# Patient Record
Sex: Female | Born: 1985 | Race: White | Hispanic: No | Marital: Married | Smoking: Never smoker
Health system: Southern US, Community
[De-identification: ages and names within clinical notes are randomized; demographics above are authoritative.]

## PROBLEM LIST (undated history)

## (undated) ENCOUNTER — Inpatient Hospital Stay (HOSPITAL_COMMUNITY): Payer: Self-pay

## (undated) DIAGNOSIS — F419 Anxiety disorder, unspecified: Secondary | ICD-10-CM

## (undated) DIAGNOSIS — G43909 Migraine, unspecified, not intractable, without status migrainosus: Secondary | ICD-10-CM

## (undated) DIAGNOSIS — F32A Depression, unspecified: Secondary | ICD-10-CM

## (undated) DIAGNOSIS — N6019 Diffuse cystic mastopathy of unspecified breast: Secondary | ICD-10-CM

## (undated) DIAGNOSIS — D649 Anemia, unspecified: Secondary | ICD-10-CM

## (undated) DIAGNOSIS — K219 Gastro-esophageal reflux disease without esophagitis: Secondary | ICD-10-CM

## (undated) HISTORY — DX: Depression, unspecified: F32.A

## (undated) HISTORY — PX: BREAST BIOPSY: SHX20

## (undated) HISTORY — DX: Gastro-esophageal reflux disease without esophagitis: K21.9

## (undated) HISTORY — DX: Migraine, unspecified, not intractable, without status migrainosus: G43.909

## (undated) HISTORY — DX: Anemia, unspecified: D64.9

## (undated) HISTORY — DX: Anxiety disorder, unspecified: F41.9

## (undated) HISTORY — PX: TONSILLECTOMY: SUR1361

## (undated) HISTORY — DX: Diffuse cystic mastopathy of unspecified breast: N60.19

## (undated) HISTORY — PX: TYMPANOSTOMY TUBE PLACEMENT: SHX32

---
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
## 2007-12-21 DIAGNOSIS — G43909 Migraine, unspecified, not intractable, without status migrainosus: Secondary | ICD-10-CM

## 2007-12-21 HISTORY — DX: Migraine, unspecified, not intractable, without status migrainosus: G43.909

## 2007-12-30 ENCOUNTER — Other Ambulatory Visit: Admission: RE | Admit: 2007-12-30 | Discharge: 2007-12-30 | Payer: Self-pay | Admitting: Obstetrics & Gynecology

## 2009-10-12 ENCOUNTER — Encounter: Admission: RE | Admit: 2009-10-12 | Discharge: 2009-10-12 | Payer: Self-pay | Admitting: Obstetrics & Gynecology

## 2011-04-08 LAB — HM PAP SMEAR: HM Pap smear: NEGATIVE

## 2011-04-13 ENCOUNTER — Ambulatory Visit (INDEPENDENT_AMBULATORY_CARE_PROVIDER_SITE_OTHER): Payer: BC Managed Care – PPO | Admitting: Family Medicine

## 2011-04-13 VITALS — BP 118/75 | HR 72 | Temp 98.5°F | Resp 16 | Ht 68.0 in | Wt 155.0 lb

## 2011-04-13 DIAGNOSIS — J029 Acute pharyngitis, unspecified: Secondary | ICD-10-CM

## 2011-04-13 DIAGNOSIS — J329 Chronic sinusitis, unspecified: Secondary | ICD-10-CM

## 2011-04-13 LAB — POCT RAPID STREP A (OFFICE): Rapid Strep A Screen: NEGATIVE

## 2011-04-13 MED ORDER — AMOXICILLIN 875 MG PO TABS: 875.0000 mg | ORAL_TABLET | Freq: Two times a day (BID) | ORAL | Status: AC

## 2011-04-13 NOTE — Patient Instructions (Signed)
Migraine Headache A migraine headache is an intense, throbbing pain on one or both sides of your head. The exact cause of a migraine headache is not always known. A migraine may be caused when nerves in the brain become irritated and release chemicals that cause swelling within blood vessels, causing pain. Many migraine sufferers have a family history of migraines. Before you get a migraine you may or may not get an aura. An aura is a group of symptoms that can predict the beginning of a migraine. An aura may include:  Visual changes such as:   Flashing lights.   Bright spots or zig-zag lines.   Tunnel vision.   Feelings of numbness.   Trouble talking.   Muscle weakness.  SYMPTOMS  Pain on one or both sides of your head.   Pain that is pulsating or throbbing in nature.   Pain that is severe enough to prevent daily activities.   Pain that is aggravated by any daily physical activity.   Nausea (feeling sick to your stomach), vomiting, or both.   Pain with exposure to bright lights, loud noises, or activity.   General sensitivity to bright lights or loud noises.  MIGRAINE TRIGGERS Examples of triggers of migraine headaches include:   Alcohol.   Smoking.   Stress.   It may be related to menses (female menstruation).   Aged cheeses.   Foods or drinks that contain nitrates, glutamate, aspartame, or tyramine.   Lack of sleep.   Chocolate.   Caffeine.   Hunger.   Medications such as nitroglycerine (used to treat chest pain), birth control pills, estrogen, and some blood pressure medications.  DIAGNOSIS  A migraine headache is often diagnosed based on:  Symptoms.   Physical examination.   A computerized X-ray scan (computed tomography, CT) of your head.  TREATMENT  Medications can help prevent migraines if they are recurrent or should they become recurrent. Your caregiver can help you with a medication or treatment program that will be helpful to you.   Lying  down in a dark, quiet room may be helpful.   Keeping a headache diary may help you find a trend as to what may be triggering your headaches.  SEEK IMMEDIATE MEDICAL CARE IF:   You have confusion, personality changes or seizures.   You have headaches that wake you from sleep.   You have an increased frequency in your headaches.   You have a stiff neck.   You have a loss of vision.   You have muscle weakness.   You start losing your balance or have trouble walking.   You feel faint or pass out.  MAKE SURE YOU:   Understand these instructions.   Will watch your condition.   Will get help right away if you are not doing well or get worse.  Document Released: 01/06/2005 Document Revised: 12/26/2010 Document Reviewed: 08/22/2008 Exit[employer] Patient Information 2012 [employer].  Sinusitis Sinuses are air pockets within the bones of your face. The growth of bacteria within a sinus leads to infection. The infection prevents the sinuses from draining. This infection is called sinusitis. SYMPTOMS  There will be different areas of pain depending on which sinuses have become infected.  The maxillary sinuses often produce pain beneath the eyes.   Frontal sinusitis may cause pain in the middle of the forehead and above the eyes.  Other problems (symptoms) include:  Toothaches.   Colored, pus-like (purulent) drainage from the nose.   Swelling, warmth, and tenderness over   the sinus areas may be signs of infection.  TREATMENT  Sinusitis is most often determined by an exam.X-rays may be taken. If x-rays have been taken, make sure you obtain your results or find out how you are to obtain them. Your caregiver may give you medications (antibiotics). These are medications that will help kill the bacteria causing the infection. You may also be given a medication (decongestant) that helps to reduce sinus swelling.  HOME CARE INSTRUCTIONS   Only take over-the-counter or prescription  medicines for pain, discomfort, or fever as directed by your caregiver.   Drink extra fluids. Fluids help thin the mucus so your sinuses can drain more easily.   Applying either moist heat or ice packs to the sinus areas may help relieve discomfort.   Use saline nasal sprays to help moisten your sinuses. The sprays can be found at your local drugstore.  SEEK IMMEDIATE MEDICAL CARE IF:  You have a fever.   You have increasing pain, severe headaches, or toothache.   You have nausea, vomiting, or drowsiness.   You develop unusual swelling around the face or trouble seeing.  MAKE SURE YOU:   Understand these instructions.   Will watch your condition.   Will get help right away if you are not doing well or get worse.  Document Released: 01/06/2005 Document Revised: 12/26/2010 Document Reviewed: 08/05/2006 Exit[employer] Patient Information 2012 [employer]. 

## 2011-04-13 NOTE — Progress Notes (Signed)
This is a 26 year old woman who comes in having run a half marathon last weekend and now suffering from one week of progressive sinus congestion and sore throat along with a migraine headache today. She notes postnasal drainage and myalgias.  Objective: No acute distress, neck supple without adenopathy  Alert and cooperative, articulate and friendly  Throat is red with postnasal drainage, bilateral mucopurulent discharge from nose  TMs normal, no respiratory distress Results for orders placed in visit on 04/13/11  POCT RAPID STREP A (OFFICE)      Component Value Range   Rapid Strep A Screen Negative  Negative    A:  Sinusitis, acute  P:  Amoxicillin with sinus type headache

## 2011-07-29 ENCOUNTER — Ambulatory Visit (INDEPENDENT_AMBULATORY_CARE_PROVIDER_SITE_OTHER): Payer: BC Managed Care – PPO | Admitting: Family Medicine

## 2011-07-29 VITALS — BP 110/68 | HR 52 | Temp 97.8°F | Resp 16 | Ht 69.5 in | Wt 160.0 lb

## 2011-07-29 DIAGNOSIS — H9319 Tinnitus, unspecified ear: Secondary | ICD-10-CM

## 2011-07-29 DIAGNOSIS — H6980 Other specified disorders of Eustachian tube, unspecified ear: Secondary | ICD-10-CM

## 2011-07-29 DIAGNOSIS — H6990 Unspecified Eustachian tube disorder, unspecified ear: Secondary | ICD-10-CM

## 2011-07-29 MED ORDER — FLUTICASONE PROPIONATE 50 MCG/ACT NA SUSP: 2.0000 | Freq: Every day | NASAL | Status: DC

## 2011-07-29 NOTE — Progress Notes (Signed)
    Patient Name: Abigail George Date of Birth: 1985-03-18 Medical Record Number: [id number] Gender: female Date of Encounter: 07/29/2011  Chief Complaint: Otalgia and Tinnitus   History of Present Illness:  Abigail George is a 26 y.o. 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  She had tubes 5x as an infant and child.  She has had problems with both ears again for the last month- she notes bilateral ear congestion, like water trapped in her ears. She also has tinnitus bilaterally.  She has noted some pain with laying her head down onto her ear.  Otherwise she is feeling well except for mild sinus congestion.   She is training for the [facility] marathon- which is in October.  She is a Runner, broadcasting/film/video  There is no problem list on file for this patient.  No past medical history on file. No past surgical history on file. History  Substance Use Topics  . Smoking status: Never Smoker   . Smokeless tobacco: Never Used  . Alcohol Use: Not on file   No family history on file. No Known Allergies  Medication list has been reviewed and updated.  Current Outpatient Prescriptions on File Prior to Visit  Medication Sig Dispense Refill  . hydrocodone-acetaminophen (LORCET-HD) 5-500 MG per capsule Take 1 capsule by mouth every 6 (six) hours as needed.      . SUMAtriptan (IMITREX) 100 MG tablet Take 100 mg by mouth every 2 (two) hours as needed.      . methylphenidate (METADATE CD) 40 MG CR capsule Take 40 mg by mouth every morning.        Review of Systems:  As per HPI- otherwise negative.   Physical Examination: Filed Vitals:   07/29/11 1030  BP: 110/68  Pulse: 52  Temp: 97.8 F (36.6 C)  Resp: 16   Filed Vitals:   07/29/11 1030  Height: 5' 9.5" (1.765 m)  Weight: 160 lb (72.576 kg)   Body mass index is 23.29 kg/(m^2). Ideal Body Weight:  Weight in (lb) to have BMI = 25: 171.4   GEN: WDWN, NAD, Non-toxic, A & O x 3, healthy appearance HEENT: Atraumatic, Normocephalic. Neck supple. No masses, No LAD.  IAC wnl, both TM with scarring, but no redness or bulging.  Nasal cavity is congested and red especially on the right. Oropharynx wnl Ears and Nose: No external deformity. CV: RRR, No M/G/R. No JVD. No thrill. No extra heart sounds. PULM: CTA B, no wheezes, crackles, rhonchi. No retractions. No resp. distress. No accessory muscle use. EXTR: No c/c/e NEURO Normal gait.  PSYCH: Normally interactive. Conversant. Not depressed or anxious appearing.  Calm demeanor.     Assessment and Plan: 1. Tinnitus    2. ETD (eustachian tube dysfunction)  fluticasone (FLONASE) 50 MCG/ACT nasal spray   Suspect that ETD is causing her symptoms.  Try flonase, and also may use sudafed OTC.  If not better within a few days to a week may fill her handwritten rx for prednisone- 40 for 3 days, then 20 for 3 days.  If still not better (if if getting worse) please call or RTC.    Abbe Amsterdam, MD

## 2011-10-07 ENCOUNTER — Other Ambulatory Visit: Payer: Self-pay | Admitting: Neurology

## 2011-10-07 DIAGNOSIS — R51 Headache: Secondary | ICD-10-CM

## 2011-10-26 ENCOUNTER — Ambulatory Visit: Payer: BC Managed Care – PPO

## 2011-10-26 ENCOUNTER — Ambulatory Visit (INDEPENDENT_AMBULATORY_CARE_PROVIDER_SITE_OTHER): Payer: BC Managed Care – PPO | Admitting: Family Medicine

## 2011-10-26 VITALS — BP 110/67 | HR 84 | Temp 97.9°F | Resp 16 | Ht 68.5 in | Wt 152.0 lb

## 2011-10-26 DIAGNOSIS — R197 Diarrhea, unspecified: Secondary | ICD-10-CM

## 2011-10-26 DIAGNOSIS — E860 Dehydration: Secondary | ICD-10-CM

## 2011-10-26 DIAGNOSIS — R110 Nausea: Secondary | ICD-10-CM

## 2011-10-26 DIAGNOSIS — A09 Infectious gastroenteritis and colitis, unspecified: Secondary | ICD-10-CM

## 2011-10-26 DIAGNOSIS — R109 Unspecified abdominal pain: Secondary | ICD-10-CM

## 2011-10-26 LAB — POCT CBC
Granulocyte percent: 84.6 %G — AB (ref 37–80)
HCT, POC: 51.2 % — AB (ref 37.7–47.9)
Hemoglobin: 16.8 g/dL — AB (ref 12.2–16.2)
Lymph, poc: 1.4 (ref 0.6–3.4)
MCH, POC: 32.1 pg — AB (ref 27–31.2)
MCHC: 32.8 g/dL (ref 31.8–35.4)
MCV: 97.9 fL — AB (ref 80–97)
MID (cbc): 0.5 (ref 0–0.9)
MPV: 8.8 fL (ref 0–99.8)
POC Granulocyte: 10.4 — AB (ref 2–6.9)
POC LYMPH PERCENT: 11.3 %L (ref 10–50)
POC MID %: 4.1 % (ref 0–12)
Platelet Count, POC: 297 10*3/uL (ref 142–424)
RBC: 5.23 M/uL (ref 4.04–5.48)
RDW, POC: 12.8 %
WBC: 12.3 10*3/uL — AB (ref 4.6–10.2)

## 2011-10-26 LAB — COMPREHENSIVE METABOLIC PANEL
ALT: 18 U/L (ref 0–35)
AST: 24 U/L (ref 0–37)
Albumin: 5.4 g/dL — ABNORMAL HIGH (ref 3.5–5.2)
Alkaline Phosphatase: 66 U/L (ref 39–117)
BUN: 13 mg/dL (ref 6–23)
CO2: 27 mEq/L (ref 19–32)
Calcium: 10.1 mg/dL (ref 8.4–10.5)
Creat: 0.76 mg/dL (ref 0.50–1.10)
Glucose, Bld: 92 mg/dL (ref 70–99)
Potassium: 3.9 mEq/L (ref 3.5–5.3)
Sodium: 140 mEq/L (ref 135–145)
Total Bilirubin: 0.6 mg/dL (ref 0.3–1.2)
Total Protein: 8.1 g/dL (ref 6.0–8.3)

## 2011-10-26 LAB — COMPREHENSIVE METABOLIC PANEL WITH GFR: Chloride: 102 meq/L (ref 96–112)

## 2011-10-26 MED ORDER — ONDANSETRON 4 MG PO TBDP: 4.0000 mg | ORAL_TABLET | Freq: Three times a day (TID) | ORAL | Status: DC | PRN

## 2011-10-26 MED ORDER — CIPROFLOXACIN HCL 500 MG PO TABS: 500.0000 mg | ORAL_TABLET | Freq: Two times a day (BID) | ORAL | Status: DC

## 2011-10-26 NOTE — Progress Notes (Signed)
[facility]:  Office Visit  Chief Complaint:  Chief Complaint  Patient presents with  . Diarrhea    x Thursday  . Emesis    x today    HPI: Abigail George is a 26 y.o. female who complains of  nonbloody diarrhea x 5 days ago. Had spinach the day prior on Wednesday that was old. No new meds, restaurants, travels beside spinach that was different. Had nonbloody Vomitus x 2 , last episode today. + nauseated. Not pregnant. Last episode of nonbloody diarrhea, took Immodium. Has had flatus. Diarrhea is now just Green watery stool. Been taking gatorade but not able to keep it down. Ran today because traing for marathon and got sick this morning ...she tried to run a 20 mile run this morning with her GI sxs and was not able to do it because she could not keep down peanut butter and fluids she had taken prior to the run. Denies new meds, travels; has city water.   History reviewed. No pertinent past medical history. History reviewed. No pertinent past surgical history. History   Social History  . Marital Status: Single    Spouse Name: N/A    Number of Children: N/A  . Years of Education: N/A   Social History Main Topics  . Smoking status: Never Smoker   . Smokeless tobacco: Never Used  . Alcohol Use: Yes  . Drug Use: No  . Sexually Active: Yes   Other Topics Concern  . None   Social History Narrative  . None   No family history on file. No Known Allergies Prior to Admission medications   Medication Sig Start Date End Date Taking? Authorizing Provider  hydrocodone-acetaminophen (LORCET-HD) 5-500 MG per capsule Take 1 capsule by mouth every 6 (six) hours as needed.   Yes Historical Provider, MD  Levonorgestrel (SKYLA) 13.5 MG IUD by Intrauterine route.   Yes Historical Provider, MD  SUMAtriptan (IMITREX) 100 MG tablet Take 100 mg by mouth every 2 (two) hours as needed.   Yes Historical Provider, MD  fluticasone (FLONASE) 50 MCG/ACT nasal spray Place 2  sprays into the nose daily. 07/29/11 07/28/12  Gwenlyn Found Copland, MD  methylphenidate (METADATE CD) 40 MG CR capsule Take 40 mg by mouth every morning.    Historical Provider, MD     ROS: The patient denies fevers, chills, night sweats, unintentional weight loss, chest pain, palpitations, wheezing, dyspnea on exertion, dysuria, hematuria, melena, numbness, weakness, or tingling.   All other systems have been reviewed and were otherwise negative with the exception of those mentioned in the HPI and as above.    PHYSICAL EXAM: Filed Vitals:   10/26/11 1135  BP: 110/67  Pulse: 84  Temp: 97.9 F (36.6 C)  Resp: 16   Filed Vitals:   10/26/11 1135  Height: 5' 8.5" (1.74 m)  Weight: 152 lb (68.947 kg)   Body mass index is 22.78 kg/(m^2).  General: Alert, no acute distress HEENT:  Normocephalic, atraumatic, oropharynx patent. OP mucosa nl, not dehydrated Cardiovascular:  Regular rate and rhythm, no rubs murmurs or gallops.  No Carotid bruits, radial pulse intact. No pedal edema.  Respiratory: Clear to auscultation bilaterally.  No wheezes, rales, or rhonchi.  No cyanosis, no use of accessory musculature GI: No organomegaly, abdomen is soft and non-tender, positive bowel sounds.  No masses. No guarding , rebound Skin: No rashes. Neurologic: Facial musculature symmetric. Psychiatric: Patient is appropriate throughout our interaction. Lymphatic: No cervical lymphadenopathy Musculoskeletal: Gait  intact.   LABS: Results for orders placed in visit on 10/26/11  POCT CBC      Component Value Range   WBC 12.3 (*) 4.6 - 10.2 K/uL   Lymph, poc 1.4  0.6 - 3.4   POC LYMPH PERCENT 11.3  10 - 50 %L   MID (cbc) 0.5  0 - 0.9   POC MID % 4.1  0 - 12 %M   POC Granulocyte 10.4 (*) 2 - 6.9   Granulocyte percent 84.6 (*) 37 - 80 %G   RBC 5.23  4.04 - 5.48 M/uL   Hemoglobin 16.8 (*) 12.2 - 16.2 g/dL   HCT, POC 16.1 (*) 09.6 - 47.9 %   MCV 97.9 (*) 80 - 97 fL   MCH, POC 32.1 (*) 27 - 31.2 pg   MCHC  32.8  31.8 - 35.4 g/dL   RDW, POC 04.5     Platelet Count, POC 297  142 - 424 K/uL   MPV 8.8  0 - 99.8 fL  COMPREHENSIVE METABOLIC PANEL      Component Value Range   Sodium 140  135 - 145 mEq/L   Potassium 3.9  3.5 - 5.3 mEq/L   Chloride 102  96 - 112 mEq/L   CO2 27  19 - 32 mEq/L   Glucose, Bld 92  70 - 99 mg/dL   BUN 13  6 - 23 mg/dL   Creat 4.09  8.11 - 9.14 mg/dL   Total Bilirubin 0.6  0.3 - 1.2 mg/dL   Alkaline Phosphatase 66  39 - 117 U/L   AST 24  0 - 37 U/L   ALT 18  0 - 35 U/L   Total Protein 8.1  6.0 - 8.3 g/dL   Albumin 5.4 (*) 3.5 - 5.2 g/dL   Calcium 78.2  8.4 - 95.6 mg/dL  STOOL CULTURE      Component Value Range   Preliminary Report Culture reincubated for better growth       EKG/XRAY:   Primary read interpreted by Dr. Conley Rolls at [facility]. cxr -no acute cardiopulmonary process abd-no free air, nonobstructive gas pattern   ASSESSMENT/PLAN: Encounter Diagnoses  Name Primary?  . Abdominal pain Yes  . Dehydration   . Diarrhea   . Gastroenteritis, infectious   . Nausea    Rx Cipro 500 mg BID x 10 days Rx Zofran ODT for nausea BRAT diet, advance as tolerated. Push fluids at home. Limit marathon training until improved. Stool cx pending when patient able to bring in sample F/u as needed or worsening sxs    Edgardo Petrenko PHUONG, DO 10/28/2011 9:57 PM

## 2011-10-28 ENCOUNTER — Encounter: Payer: Self-pay | Admitting: Family Medicine

## 2011-10-28 NOTE — Patient Instructions (Signed)
Diarrhea  Diarrhea is watery poop (stool). The most common cause of diarrhea is a germ. Other causes include:   Food poisoning.   A reaction to medicine.  HOME CARE    Drink clear fluids. This can stop you from losing too much body fluid (dehydration).   Drink enough fluids to keep your pee (urine) clear or pale yellow.   Avoid solid foods and dairy products until you start to feel better. Then start eating bland foods, such as:   Bananas.   Rice.   Crackers.   Applesauce.   Dry toast.   Avoid spicy foods, caffeine, and alcohol.   Your doctor may give medicine to help with cramps and watery poop. Take this as told. Avoid these medicines if you have a fever or blood in your poop.   Take your medicine as told. Finish them even if you start to feel better.  GET HELP RIGHT AWAY IF:    The watery poop lasts longer than 3 days.   You have a fever.   Your baby is older than 3 months with a rectal temperature of 100.5 F (38.1 C) or higher for more than 1 day.   There is blood in your poop.   You start to throw up (vomit).   You lose too much fluid.  MAKE SURE YOU:    Understand these instructions.   Will watch your condition.   Will get help right away if you are not doing well or get worse.  Document Released: 06/25/2007 Document Revised: 03/31/2011 Document Reviewed: 06/25/2007  [employer] Patient Information 2013 [employer].

## 2011-10-31 LAB — STOOL CULTURE

## 2012-01-16 ENCOUNTER — Ambulatory Visit (INDEPENDENT_AMBULATORY_CARE_PROVIDER_SITE_OTHER): Payer: BC Managed Care – PPO | Admitting: Family Medicine

## 2012-01-16 VITALS — BP 117/68 | HR 71 | Temp 97.7°F | Resp 16 | Ht 68.0 in | Wt 158.0 lb

## 2012-01-16 DIAGNOSIS — L089 Local infection of the skin and subcutaneous tissue, unspecified: Secondary | ICD-10-CM

## 2012-01-16 DIAGNOSIS — Z8619 Personal history of other infectious and parasitic diseases: Secondary | ICD-10-CM

## 2012-01-16 DIAGNOSIS — IMO0002 Reserved for concepts with insufficient information to code with codable children: Secondary | ICD-10-CM

## 2012-01-16 DIAGNOSIS — Z8742 Personal history of other diseases of the female genital tract: Secondary | ICD-10-CM

## 2012-01-16 MED ORDER — FLUCONAZOLE 150 MG PO TABS: 150.0000 mg | ORAL_TABLET | Freq: Once | ORAL | Status: DC

## 2012-01-16 MED ORDER — DOXYCYCLINE HYCLATE 100 MG PO TABS: 100.0000 mg | ORAL_TABLET | Freq: Two times a day (BID) | ORAL | Status: DC

## 2012-01-16 NOTE — Progress Notes (Signed)
Subjective:    Patient ID: Abigail George, female    DOB: 11/27/1985, 26 y.o.   MRN: [id number]  HPI Abigail George is a 26 y.o. female  Had manicure 2 weeks ago - had hangnail - picked at it. More sore, hot, swollen past five days. No fever, slight discharge initially, no pus now. Just sore. Pain down in to finger. No known hx of MRSA.  Tx: peroxide, neosporin.   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   Review of Systems  Constitutional: Negative for fever and chills.  Musculoskeletal: Positive for arthralgias.  Skin: Positive for color change.       Objective:   Physical Exam  Constitutional: She is oriented to person, place, and time. She appears well-developed and well-nourished. No distress.  Pulmonary/Chest: Effort normal.  Musculoskeletal:       Left hand: She exhibits tenderness. normal sensation noted. Normal strength noted.       Hands: Neurological: She is alert and oriented to person, place, and time.  Skin: Skin is warm and dry.  Psychiatric: She has a normal mood and affect. Her behavior is normal.       Assessment & Plan:  Abigail George is a 26 y.o. female  1. Finger infection  Wound culture, doxycycline (VIBRA-TABS) 100 MG tablet  2. Hx of candidal vulvovaginitis  fluconazole (DIFLUCAN) 150 MG tablet  3. Paronychia  doxycycline (VIBRA-TABS) 100 MG tablet    L 3rd finger - mild-mod paronychia. Start doxy, warm soaks. Diflucan if needed. rtc precautions.  Patient Instructions  Warm soaks 4-5 times per day, start oral antibiotic, bandage as needed. Diflucan at end of course of antibiotic if needed.  Return to the clinic or go to the nearest emergency room if any of your symptoms worsen or new symptoms occur. Paronychia Paronychia is an inflammatory reaction involving the folds of the skin surrounding the fingernail. This is commonly caused by an infection in the skin around a nail. The most common cause of paronychia is frequent wetting of the hands (as  seen with bartenders, food servers, nurses or others who wet their hands). This makes the skin around the fingernail susceptible to infection by bacteria (germs) or fungus. Other predisposing factors are:  Aggressive manicuring.  Nail biting.  Thumb sucking. The most common cause is a staphylococcal (a type of germ) infection, or a fungal (Candida) infection. When caused by a germ, it usually comes on suddenly with redness, swelling, pus and is often painful. It may get under the nail and form an abscess (collection of pus), or form an abscess around the nail. If the nail itself is infected with a fungus, the treatment is usually prolonged and may require oral medicine for up to one year. Your caregiver will determine the length of time treatment is required. The paronychia caused by bacteria (germs) may largely be avoided by not pulling on hangnails or picking at cuticles. When the infection occurs at the tips of the finger it is called felon. When the cause of paronychia is from the herpes simplex virus (HSV) it is called herpetic whitlow. TREATMENT  When an abscess is present treatment is often incision and drainage. This means that the abscess must be cut open so the pus can get out. When this is done, the following home care instructions should be followed. HOME CARE INSTRUCTIONS   It is important to keep the affected fingers very dry. Rubber or plastic gloves over cotton gloves should be used whenever the hand must be  placed in water.  Keep wound clean, dry and dressed as suggested by your caregiver between warm soaks or warm compresses.  Soak in warm water for fifteen to twenty minutes three to four times per day for bacterial infections. Fungal infections are very difficult to treat, so often require treatment for long periods of time.  For bacterial (germ) infections take antibiotics (medicine which kill germs) as directed and finish the prescription, even if the problem appears to be  solved before the medicine is gone.  Only take over-the-counter or prescription medicines for pain, discomfort, or fever as directed by your caregiver. SEEK IMMEDIATE MEDICAL CARE IF:  You have redness, swelling, or increasing pain in the wound.  You notice pus coming from the wound.  You have a fever.  You notice a bad smell coming from the wound or dressing. Document Released: 07/02/2000 Document Revised: 03/31/2011 Document Reviewed: 03/03/2008 [facility] Patient Information 2013 [place], [place].

## 2012-01-16 NOTE — Patient Instructions (Addendum)
Warm soaks 4-5 times per day, start oral antibiotic, bandage as needed. Diflucan at end of course of antibiotic if needed.  Return to the clinic or go to the nearest emergency room if any of your symptoms worsen or new symptoms occur. Paronychia Paronychia is an inflammatory reaction involving the folds of the skin surrounding the fingernail. This is commonly caused by an infection in the skin around a nail. The most common cause of paronychia is frequent wetting of the hands (as seen with bartenders, food servers, nurses or others who wet their hands). This makes the skin around the fingernail susceptible to infection by bacteria (germs) or fungus. Other predisposing factors are:  Aggressive manicuring.  Nail biting.  Thumb sucking. The most common cause is a staphylococcal (a type of germ) infection, or a fungal (Candida) infection. When caused by a germ, it usually comes on suddenly with redness, swelling, pus and is often painful. It may get under the nail and form an abscess (collection of pus), or form an abscess around the nail. If the nail itself is infected with a fungus, the treatment is usually prolonged and may require oral medicine for up to one year. Your caregiver will determine the length of time treatment is required. The paronychia caused by bacteria (germs) may largely be avoided by not pulling on hangnails or picking at cuticles. When the infection occurs at the tips of the finger it is called felon. When the cause of paronychia is from the herpes simplex virus (HSV) it is called herpetic whitlow. TREATMENT  When an abscess is present treatment is often incision and drainage. This means that the abscess must be cut open so the pus can get out. When this is done, the following home care instructions should be followed. HOME CARE INSTRUCTIONS   It is important to keep the affected fingers very dry. Rubber or plastic gloves over cotton gloves should be used whenever the hand must be  placed in water.  Keep wound clean, dry and dressed as suggested by your caregiver between warm soaks or warm compresses.  Soak in warm water for fifteen to twenty minutes three to four times per day for bacterial infections. Fungal infections are very difficult to treat, so often require treatment for long periods of time.  For bacterial (germ) infections take antibiotics (medicine which kill germs) as directed and finish the prescription, even if the problem appears to be solved before the medicine is gone.  Only take over-the-counter or prescription medicines for pain, discomfort, or fever as directed by your caregiver. SEEK IMMEDIATE MEDICAL CARE IF:  You have redness, swelling, or increasing pain in the wound.  You notice pus coming from the wound.  You have a fever.  You notice a bad smell coming from the wound or dressing. Document Released: 07/02/2000 Document Revised: 03/31/2011 Document Reviewed: 03/03/2008 [facility] Patient Information 2013 [place], [place].

## 2012-01-18 LAB — WOUND CULTURE: Gram Stain: NONE SEEN

## 2012-04-22 ENCOUNTER — Ambulatory Visit (INDEPENDENT_AMBULATORY_CARE_PROVIDER_SITE_OTHER): Payer: BC Managed Care – PPO | Admitting: Family Medicine

## 2012-04-22 ENCOUNTER — Ambulatory Visit: Payer: BC Managed Care – PPO

## 2012-04-22 DIAGNOSIS — S90129A Contusion of unspecified lesser toe(s) without damage to nail, initial encounter: Secondary | ICD-10-CM

## 2012-04-22 DIAGNOSIS — M79609 Pain in unspecified limb: Secondary | ICD-10-CM

## 2012-04-22 DIAGNOSIS — S90121A Contusion of right lesser toe(s) without damage to nail, initial encounter: Secondary | ICD-10-CM

## 2012-04-22 NOTE — Progress Notes (Signed)
[address]   [place], [place]  [address]   [phone]  Subjective:    Patient ID: Abigail George, female    DOB: [birth year], 27 y.o.   MRN: [id number]  HPI This 27 y.o. female presents for evaluation of foot pain.  Runner avid.  Went for a sports massage followed by run later that day.  Onset of R toe pain and distal foot pain.  No obvious pain five days ago.  No running for two days afterwards.  Ran yesterday without pain.  Now pain with walking.  Pain located in 2nd, 3rd toes; pain along distal distal foot.  Plantar surface.  Icing, elevation, no medication.  No previous injury.  Running; training for 1/2 marathon.  Ran 4 miles, 3 miles yesterday.  No major injury.  LMP 03-30-12 normal; IUD.    PCP: Erling Conte   Review of Systems  Constitutional: Negative for chills, diaphoresis and fatigue.  Musculoskeletal: Positive for myalgias, joint swelling, arthralgias and gait problem.  Neurological: Negative for weakness and numbness.        Past Medical History  Diagnosis Date  . Anxiety     History reviewed. No pertinent past surgical history.  Prior to Admission medications   Medication Sig Start Date End Date Taking? Authorizing Provider  fluticasone (FLONASE) 50 MCG/ACT nasal spray Place 2 sprays into the nose daily. 07/29/11 07/28/12 Yes Gwenlyn Found Copland, MD  hydrocodone-acetaminophen (LORCET-HD) 5-500 MG per capsule Take 1 capsule by mouth every 6 (six) hours as needed.   Yes Historical Provider, MD  Levonorgestrel (SKYLA) 13.5 MG IUD by Intrauterine route.   Yes Historical Provider, MD  methylphenidate (METADATE CD) 40 MG CR capsule Take 40 mg by mouth every morning.   Yes Historical Provider, MD  SUMAtriptan (IMITREX) 100 MG tablet Take 100 mg by mouth every 2 (two) hours as needed.   Yes Historical Provider, MD  ciprofloxacin (CIPRO) 500 MG tablet Take 1 tablet (500 mg total) by mouth 2 (two) times daily. 10/26/11   Thao P Le, DO  doxycycline (VIBRA-TABS) 100 MG tablet Take 1  tablet (100 mg total) by mouth 2 (two) times daily. 01/16/12   Shade Flood, MD  fluconazole (DIFLUCAN) 150 MG tablet Take 1 tablet (150 mg total) by mouth once. 01/16/12   Shade Flood, MD  ondansetron (ZOFRAN ODT) 4 MG disintegrating tablet Take 1 tablet (4 mg total) by mouth every 8 (eight) hours as needed for nausea. 10/26/11   Thao P Le, DO    No Known Allergies  History   Social History  . Marital Status: Single    Spouse Name: N/A    Number of Children: N/A  . Years of Education: N/A   Occupational History  . Not on file.   Social History Main Topics  . Smoking status: Never Smoker   . Smokeless tobacco: Never Used  . Alcohol Use: Yes  . Drug Use: No  . Sexually Active: Yes   Other Topics Concern  . Not on file   Social History Narrative  . No narrative on file    History reviewed. No pertinent family history.  Objective:   Physical Exam  Nursing note and vitals reviewed. Constitutional: She is oriented to person, place, and time. She appears well-developed and well-nourished. No distress.  Cardiovascular: Intact distal pulses.   CAPILLARY REFILL< 3 SECONDS.  Musculoskeletal:       Right foot: She exhibits decreased range of motion, tenderness, bony tenderness and swelling. She  exhibits normal capillary refill, no crepitus, no deformity and no laceration.  R FOOT:  +TTP PROXIMAL 3RD DIGIT WITH MILD SWELLING; MILD TTP BASE OF 3RD AND 2ND DIGITS; FULL ROM 2ND AND 3RD DIGITS.  NON-TENDER METATARSALS THROUGHOUT; METATARSAL SQUEEZE NEGATIVE.  Neurological: She is alert and oriented to person, place, and time.  Skin: Skin is warm and dry. No rash noted. She is not diaphoretic. No erythema.  No ecchymoses.  Psychiatric: She has a normal mood and affect. Her behavior is normal.    [facility] reading (PRIMARY) by  Dr. Katrinka Blazing.  2ND AND 3RD TOES: NAD.  TOES BUDDY TAPED IN OFFICE BY MELANIE.      Assessment & Plan:  Pain of toe, right - Plan: DG Toe 3rd Right, DG  Toe 2nd Right  Contusion, toe, right, initial encounter    1.  Pain/Contusion R toes:  New.  Negative films.  Recommend ice, elevation, rest, NSAIDs scheduled for next 1-2 weeks.  Decrease exercise or stop exercise for next two weeks.  Recommend supportive shoes at all times and to avoid walking barefoot.  If no improvement in 2-3 weeks, call for podiatry or ortho referral.  Toes buddy taped in office.  No orders of the defined types were placed in this encounter.

## 2012-04-22 NOTE — Patient Instructions (Addendum)
Pain of toe, right - Plan: DG Toe 3rd Right, DG Toe 2nd Right  Contusion, toe, right, initial encounter    1.  BUDDY TAPE TOES FOR THE NEXT TWO WEEKS.   2.  RETURN FOR PERSISTENT OR WORSENING PAIN.

## 2012-05-03 ENCOUNTER — Encounter: Payer: Self-pay | Admitting: *Deleted

## 2012-05-04 ENCOUNTER — Encounter: Payer: Self-pay | Admitting: Obstetrics & Gynecology

## 2012-05-04 ENCOUNTER — Ambulatory Visit (INDEPENDENT_AMBULATORY_CARE_PROVIDER_SITE_OTHER): Payer: BC Managed Care – PPO | Admitting: Obstetrics & Gynecology

## 2012-05-04 VITALS — BP 130/84 | Ht 69.0 in | Wt 150.0 lb

## 2012-05-04 DIAGNOSIS — Z124 Encounter for screening for malignant neoplasm of cervix: Secondary | ICD-10-CM

## 2012-05-04 DIAGNOSIS — Z01419 Encounter for gynecological examination (general) (routine) without abnormal findings: Secondary | ICD-10-CM

## 2012-05-04 DIAGNOSIS — H6980 Other specified disorders of Eustachian tube, unspecified ear: Secondary | ICD-10-CM

## 2012-05-04 DIAGNOSIS — Z0000 Encounter for general adult medical examination without abnormal findings: Secondary | ICD-10-CM

## 2012-05-04 LAB — POCT URINALYSIS DIPSTICK: Leukocytes, UA: NEGATIVE

## 2012-05-04 MED ORDER — ALPRAZOLAM 0.5 MG PO TABS: 0.5000 mg | ORAL_TABLET | ORAL | Status: DC | PRN

## 2012-05-04 MED ORDER — FLUTICASONE PROPIONATE 50 MCG/ACT NA SUSP: 2.0000 | Freq: Every day | NASAL | Status: DC

## 2012-05-04 NOTE — Progress Notes (Signed)
27 y.o. No obstetric history on file. SingleCaucasianF here for annual exam.  Cycles are not regular but very light.  Flow can last for up to 14 days.  Normally, less than seven days.  Occasional cramping.  Engaged!!  Getting married October 25th.  Had scalp laceration which was stitched in January.  Major issue for her is toe pain due to running.  Just did [place] half marathon.  Qualified for [facility]!!  If foot pain continues, will have foot MRI.    Patient's last menstrual period was 05/04/2012.          Sexually active: yes  The current method of family planning is IUD Christean Grief).   Placed 04/08/11. Exercising: yes  running, weight lifting,swimming 5-6x/wk Smoker:  no  Health Maintenance: Pap:  04/08/11 MMG:  none Colonoscopy: none BMD:   none TDaP:  2009 Labs: HGB- 14.6  Urine- Normal   reports that she has never smoked. She has never used smokeless tobacco. She reports that  drinks alcohol. She reports that she does not use illicit drugs.  Past Medical History  Diagnosis Date  . Anxiety   . Migraines 12/2007  . Fibrocystic breast changes     No past surgical history on file.  Current Outpatient Prescriptions  Medication Sig Dispense Refill  . Calcium Carbonate-Vitamin D (CALCIUM + D PO) Take by mouth daily.      . fluticasone (FLONASE) 50 MCG/ACT nasal spray Place 2 sprays into the nose daily.  16 g  6  . hydrocodone-acetaminophen (LORCET-HD) 5-500 MG per capsule Take 1 capsule by mouth every 6 (six) hours as needed.      . Levonorgestrel (SKYLA) 13.5 MG IUD by Intrauterine route.      . methylphenidate (METADATE CD) 40 MG CR capsule Take 40 mg by mouth every morning.      . Multiple Vitamins-Minerals (MULTIVITAMIN PO) Take by mouth daily.      . ondansetron (ZOFRAN ODT) 4 MG disintegrating tablet Take 1 tablet (4 mg total) by mouth every 8 (eight) hours as needed for nausea.  20 tablet  0  . SUMAtriptan (IMITREX) 100 MG tablet Take 100 mg by mouth every 2 (two) hours as  needed.       No current facility-administered medications for this visit.    No family history on file.  ROS:  Pertinent items are noted in HPI.  Otherwise, a comprehensive ROS was negative.  Exam:   BP 130/84  Ht 5\' 9"  (1.753 m)  Wt 150 lb (68.04 kg)  BMI 22.14 kg/m2  LMP 05/04/2012  Height:   Height: 5\' 9"  (175.3 cm)  Ht Readings from Last 3 Encounters:  05/04/12 5\' 9"  (1.753 m)  04/22/12 5\' 9"  (1.753 m)  01/16/12 5\' 8"  (1.727 m)    General appearance: alert, cooperative and appears stated age Head: Normocephalic, without obvious abnormality, atraumatic Neck: no adenopathy, supple, symmetrical, trachea midline and thyroid normal to inspection and palpation Lungs: clear to auscultation bilaterally Breasts: normal appearance, no masses or tenderness, on right; area of fibrocystic changes 12 o'clock above areola on left Heart: regular rate and rhythm Abdomen: soft, non-tender; bowel sounds normal; no masses,  no organomegaly Extremities: extremities normal, atraumatic, no cyanosis or edema Skin: Skin color, texture, turgor normal. No rashes or lesions Lymph nodes: Cervical, supraclavicular, and axillary nodes normal. No abnormal inguinal nodes palpated Neurologic: Grossly normal   Pelvic: External genitalia:  no lesions  Urethra:  normal appearing urethra with no masses, tenderness or lesions              Bartholins and Skenes: normal                 Vagina: normal appearing vagina with normal color and discharge, no lesions              Cervix: no lesions and IUD string noted, ~1cm              Pap taken: yes Bimanual Exam:  Uterus:  normal size, contour, position, consistency, mobility, non-tender              Adnexa: no mass, fullness, tenderness               Rectovaginal: Confirms               Anus:  normal sphincter tone, no lesions  A:  Well Woman with normal exam H/O migraines with aura Seasonal allergies with sinus issues  P:   pap smear (pt  aware of guidelines but requests yearly due to grandmother dying due to cervical cancer Flonase Rx given Xanax 0.5mg  prn #30/0RF return annually or prn  An After Visit Summary was printed and given to the patient.

## 2012-05-04 NOTE — Patient Instructions (Signed)

## 2012-05-06 LAB — HEMOGLOBIN, FINGERSTICK: Hemoglobin, fingerstick: 14.6 g/dL (ref 12.0–16.0)

## 2012-05-06 LAB — IPS PAP SMEAR ONLY

## 2012-05-12 ENCOUNTER — Encounter: Payer: Self-pay | Admitting: Radiology

## 2012-05-12 ENCOUNTER — Telehealth: Payer: Self-pay

## 2012-05-12 NOTE — Telephone Encounter (Signed)
PT WOULD LIKE TO COME BY AND P/U A COPY OF HER XRAYS. HAVE AN APPT ON Friday PLEASE CALL [phone]

## 2012-06-22 ENCOUNTER — Telehealth: Payer: Self-pay | Admitting: Obstetrics & Gynecology

## 2012-06-22 NOTE — Telephone Encounter (Signed)
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 Patient requesting a note for work allowing her access to her medicine, water and "a Coke" to help with migraines during testing. Please advise.

## 2012-06-23 ENCOUNTER — Telehealth: Payer: Self-pay

## 2012-06-23 ENCOUNTER — Encounter: Payer: Self-pay | Admitting: Obstetrics & Gynecology

## 2012-06-23 NOTE — Telephone Encounter (Signed)
I will have Tresa Endo call patient and let her know it is done.

## 2012-06-23 NOTE — Telephone Encounter (Signed)
6/4 lmtcb//kn

## 2012-06-23 NOTE — Telephone Encounter (Signed)
Left message on CB# home/ cell of letter for work is ready to pick up at our front desk.

## 2012-06-24 NOTE — Telephone Encounter (Signed)
Patient came to pick up letter and said it was perfect. She will call back if needs anything else.

## 2012-08-12 ENCOUNTER — Telehealth: Payer: Self-pay | Admitting: Obstetrics & Gynecology

## 2012-08-12 NOTE — Telephone Encounter (Signed)
Call back to patient and notified dr Hyacinth Meeker can work her in at 1230 tomorrow and patient is agreeable and appreciative.

## 2012-08-12 NOTE — Telephone Encounter (Signed)
Patient last AEX 05/04/2012 with Dr. Hyacinth Meeker See note below routed to triage pool

## 2012-08-12 NOTE — Telephone Encounter (Signed)
Patient call in C/O Cyst/Lump on Lt. Breast . Tried to find her appointment with Dr. Hyacinth Meeker. Had to make appointment with Dr. Edward Jolly. On tomorrow @2 :30 pm . Patient ask me to inform Dr. Hyacinth Meeker. 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

## 2012-08-13 ENCOUNTER — Encounter: Payer: Self-pay | Admitting: Obstetrics & Gynecology

## 2012-08-13 ENCOUNTER — Ambulatory Visit (INDEPENDENT_AMBULATORY_CARE_PROVIDER_SITE_OTHER): Payer: BC Managed Care – PPO | Admitting: Obstetrics & Gynecology

## 2012-08-13 ENCOUNTER — Telehealth: Payer: Self-pay | Admitting: *Deleted

## 2012-08-13 VITALS — BP 120/60 | HR 68 | Temp 99.2°F | Ht 69.0 in | Wt 149.0 lb

## 2012-08-13 DIAGNOSIS — N6019 Diffuse cystic mastopathy of unspecified breast: Secondary | ICD-10-CM

## 2012-08-13 DIAGNOSIS — N644 Mastodynia: Secondary | ICD-10-CM

## 2012-08-13 DIAGNOSIS — N6012 Diffuse cystic mastopathy of left breast: Secondary | ICD-10-CM

## 2012-08-13 NOTE — Progress Notes (Signed)
Subjective:     Patient ID: Abigail George, female   DOB: [birth year], 27 y.o.   MRN: [id number]  HPI 27 yo G0 engaged WF with 5-6 day hx of enlarged and very tender left breast.  Reports she does feel this from time to time but that her cycle usually starts and the pain resolves.  She feels her left breast is twice the size of the right breast.  Review of Systems  Constitutional: Negative.   Endocrine: Negative.   Genitourinary: Negative.   Skin: Negative.        Objective:   Physical Exam  Constitutional: She is oriented to person, place, and time. She appears well-developed and well-nourished.  Neck: Normal range of motion. Neck supple. No thyromegaly present.  Pulmonary/Chest: Right breast exhibits no inverted nipple, no mass, no nipple discharge, no skin change and no tenderness. Left breast exhibits mass and tenderness. Left breast exhibits no inverted nipple, no nipple discharge and no skin change. Breasts are symmetrical.    Lymphadenopathy:    She has no cervical adenopathy.    She has no axillary adenopathy.  Neurological: She is alert and oriented to person, place, and time.  Skin: Skin is warm and dry.  Psychiatric: She has a normal mood and affect.       Assessment:     Breast tenderness, fibrocystic changes,      Plan:     Breast MMG and ultrasound on left.

## 2012-08-13 NOTE — Telephone Encounter (Signed)
[facility] called. Scheduled Diagnostic Mammogram with Ultrasound if necessary. Patient notified of appt. For July 29th @ 8:45am.

## 2012-10-22 ENCOUNTER — Telehealth: Payer: Self-pay | Admitting: Obstetrics & Gynecology

## 2012-10-22 MED ORDER — FLUCONAZOLE 150 MG PO TABS: 150.0000 mg | ORAL_TABLET | Freq: Once | ORAL | Status: DC

## 2012-10-22 NOTE — Telephone Encounter (Signed)
Patient requesting an RX to treat a yeast infection she thinks was caused by her antibiotic please? Patient declined appointment.  [employer]

## 2012-10-22 NOTE — Telephone Encounter (Signed)
rx done.  Pt called and i left msg that this was done.

## 2012-10-22 NOTE — Telephone Encounter (Signed)
LMTCB  aa 

## 2012-10-22 NOTE — Telephone Encounter (Signed)
Spoke with pt who is having itching and burning after beginning amoxicillin 2 days ago. No discharge currently, but pt worried it will get worse as she takes more of the abx. Pt also getting married in 22 days and mentioned getting a second yeast Rx as she will be in the [place] wearing a bathing suit a lot on her honeymoon. Reports SM said she would write this for her. Please advise.

## 2013-01-12 ENCOUNTER — Ambulatory Visit (INDEPENDENT_AMBULATORY_CARE_PROVIDER_SITE_OTHER): Payer: BC Managed Care – PPO | Admitting: Physician Assistant

## 2013-01-12 VITALS — BP 110/70 | HR 71 | Temp 97.9°F | Resp 16 | Ht 69.0 in | Wt 150.0 lb

## 2013-01-12 DIAGNOSIS — J329 Chronic sinusitis, unspecified: Secondary | ICD-10-CM

## 2013-01-12 DIAGNOSIS — H6980 Other specified disorders of Eustachian tube, unspecified ear: Secondary | ICD-10-CM

## 2013-01-12 DIAGNOSIS — G43909 Migraine, unspecified, not intractable, without status migrainosus: Secondary | ICD-10-CM

## 2013-01-12 MED ORDER — CEFDINIR 300 MG PO CAPS: 300.0000 mg | ORAL_CAPSULE | Freq: Two times a day (BID) | ORAL | Status: DC

## 2013-01-12 MED ORDER — GUAIFENESIN ER 1200 MG PO TB12: 1.0000 | ORAL_TABLET | Freq: Two times a day (BID) | ORAL | Status: AC

## 2013-01-12 MED ORDER — FLUTICASONE PROPIONATE 50 MCG/ACT NA SUSP: 1.0000 | Freq: Every day | NASAL | Status: DC

## 2013-01-12 NOTE — Progress Notes (Signed)
   Subjective:    Patient ID: Abigail George, female    DOB: 09-01-1985, 27 y.o.   MRN: [id number]  HPI Pt presents to clinic with concerns about her sinuses.  She has been having problems for about a week but it seems to be getting worse.  She did not start with a cold - she has seasonal allergies but feels like they only cause her trouble at the change in the season, during these times she uses Flonase.  She had a migraine yesterday and then another one today.  Yesterday her Imitrex worked but she thought she would be seen today because she does not want to wake up with another one tomorrow. She has L max sinus tenderness and she had teeth pain yesterday.  She is using a netti-pot and getting out green stuff but when she blows she only gets out fresh blood.  She has a humidifier in her bedroom and they keep the heat really low (like 30F)   OTC - motrin for headaches Review of Systems  Constitutional: Negative for fever and chills.  HENT: Positive for congestion and postnasal drip (mild). Negative for rhinorrhea.   Gastrointestinal: Negative for nausea.  Musculoskeletal: Negative for myalgias.  Neurological: Positive for headaches.       Objective:   Physical Exam  Vitals reviewed. Constitutional: She is oriented to person, place, and time. She appears well-developed and well-nourished.  HENT:  Head: Normocephalic and atraumatic.  Right Ear: Hearing, tympanic membrane, external ear and ear canal normal.  Left Ear: Hearing, tympanic membrane, external ear and ear canal normal.  Nose: Mucosal edema (red) present. Left sinus exhibits maxillary sinus tenderness.  Mouth/Throat: Uvula is midline and oropharynx is clear and moist.  Eyes: Conjunctivae are normal.  Neck: Normal range of motion.  Cardiovascular: Normal rate, regular rhythm and normal heart sounds.   No murmur heard. Pulmonary/Chest: Effort normal and breath sounds normal.  Lymphadenopathy:    She has no cervical  adenopathy.  Neurological: She is alert and oriented to person, place, and time.  Skin: Skin is warm and dry.  Psychiatric: She has a normal mood and affect. Her behavior is normal. Judgment and thought content normal.       Assessment & Plan:  Sinus infection - Plan: cefdinir (OMNICEF) 300 MG capsule, Guaifenesin (MUCINEX MAXIMUM STRENGTH) 1200 MG TB12  Migraine  ETD (eustachian tube dysfunction), unspecified laterality - Plan: fluticasone (FLONASE) 50 MCG/ACT nasal spray -   Pt has a migraine triggered from a sinus infection - she will start an abx and mucinex to help thin about the mucus and she will try using flonase daily to prevent these since she has had 2 sinus infections that started without colds in the past 3 months.  Benny Lennert PA-C 01/12/2013 11:41 AM

## 2013-02-01 ENCOUNTER — Other Ambulatory Visit: Payer: Self-pay | Admitting: Neurology

## 2013-02-01 DIAGNOSIS — R51 Headache: Principal | ICD-10-CM

## 2013-02-01 DIAGNOSIS — R519 Headache, unspecified: Secondary | ICD-10-CM

## 2013-02-01 DIAGNOSIS — Z8249 Family history of ischemic heart disease and other diseases of the circulatory system: Secondary | ICD-10-CM

## 2013-02-03 ENCOUNTER — Telehealth: Payer: Self-pay | Admitting: Obstetrics & Gynecology

## 2013-02-03 NOTE — Telephone Encounter (Signed)
FYI.  The information about Abigail George that you included in note pertains ONLY to pelvic MRI.  All other MRIs are fine and do not interfere with the [facility].  I called and clarified for the patient.  Thank you.

## 2013-02-03 NOTE — Telephone Encounter (Signed)
Pt says she is having a MRI done on Tuesday and wondering if she can have that done since she has the skyla iud?

## 2013-02-03 NOTE — Telephone Encounter (Signed)
Patient is having MRI Head 02/08/13. Advised patient according to [facility] website regarding MRI. Advised to be sure to bring [facility] with her to appointment and notify technician that she does have Skyla. Completion of MRI is up to technologist/Radiologist. Patient verbalized understanding, she has Skyla card and will call imaging center at this time to discuss. Advised most likely should not be a problem but that imaging center has final approval.   Per [facility]riefSkyla website:    Non-clinical testing has demonstrated that [facility] GriefSkyla is MR Conditional. [facility] GriefSkyla can be safely scanned only under specific conditions: . Static magnetic field of 3 Tesla or less . Spatial gradient field of 36,000 Gauss/cm (T/m) or less . Maximum whole body averaged specific absorption rate (SAR) of 4W/kg in the First Level Controlled mode for 15 minutes of continuous scanning In non-clinical testing, the Skyla produced a temperature rise of less than 1.8C at a maximum whole body averaged specific absorption rate (SAR) of 2.9 W/kg, for 15 minutes of MR scanning at 3T using a transit/receive body coil. MR Image quality may be compromised (that is, a small amount of artifact may occur) if the area of interest is in the exact same area or relatively close to the position of Skyla. Image artifact extended up to 5 mm from [facility] in a Gradient Echo pulse sequence.  Routing to provider for final review. Patient agreeable to disposition. Will close encounter

## 2013-02-08 ENCOUNTER — Ambulatory Visit
Admission: RE | Admit: 2013-02-08 | Discharge: 2013-02-08 | Disposition: A | Discharge status: Home / Self Care | Payer: BC Managed Care – PPO | Source: Ambulatory Visit | Attending: Neurology | Admitting: Neurology

## 2013-02-08 DIAGNOSIS — R51 Headache: Principal | ICD-10-CM

## 2013-02-08 DIAGNOSIS — Z8249 Family history of ischemic heart disease and other diseases of the circulatory system: Secondary | ICD-10-CM

## 2013-02-08 DIAGNOSIS — R519 Headache, unspecified: Secondary | ICD-10-CM

## 2013-04-07 ENCOUNTER — Encounter: Payer: Self-pay | Admitting: Obstetrics & Gynecology

## 2013-05-13 ENCOUNTER — Telehealth: Payer: Self-pay | Admitting: Nurse Practitioner

## 2013-05-13 NOTE — Telephone Encounter (Signed)
Left a msg regarding 05/16/13 appointment canceled and needs to be rescheduled.

## 2013-05-16 ENCOUNTER — Ambulatory Visit: Payer: BC Managed Care – PPO | Admitting: Nurse Practitioner

## 2013-06-14 ENCOUNTER — Telehealth: Payer: Self-pay | Admitting: Obstetrics & Gynecology

## 2013-06-14 NOTE — Telephone Encounter (Signed)
Pt is requesting a note for end of grade testing. Pt states she suffers from migraines and low blood sugar and is not allowed to have access to a drink or snack. Pt  Is asking for a note stating she may have water, a coke and a snack during end of grading testing.

## 2013-06-15 ENCOUNTER — Encounter: Payer: Self-pay | Admitting: Emergency Medicine

## 2013-06-15 NOTE — Telephone Encounter (Signed)
Dr. Hyacinth Meeker, we sent a letter with the same letter last year, okay to send again?

## 2013-06-15 NOTE — Telephone Encounter (Signed)
Dr. Hyacinth Meeker electronically signed off on letter.  Message left to return call to [place] at [phone].   Letter at front desk for her pick up.

## 2013-06-15 NOTE — Telephone Encounter (Signed)
This is fine.  I already sent letter back to you.  Encounter closed.

## 2013-07-09 ENCOUNTER — Ambulatory Visit (INDEPENDENT_AMBULATORY_CARE_PROVIDER_SITE_OTHER): Payer: BC Managed Care – PPO | Admitting: Family Medicine

## 2013-07-09 VITALS — BP 108/60 | HR 69 | Temp 98.0°F | Resp 14 | Ht 69.0 in | Wt 155.6 lb

## 2013-07-09 DIAGNOSIS — J0100 Acute maxillary sinusitis, unspecified: Secondary | ICD-10-CM

## 2013-07-09 DIAGNOSIS — J029 Acute pharyngitis, unspecified: Secondary | ICD-10-CM

## 2013-07-09 LAB — POCT RAPID STREP A (OFFICE): Rapid Strep A Screen: NEGATIVE

## 2013-07-09 MED ORDER — CEFDINIR 300 MG PO CAPS: 300.0000 mg | ORAL_CAPSULE | Freq: Two times a day (BID) | ORAL | Status: DC

## 2013-07-09 NOTE — Patient Instructions (Signed)
Your strep test is negative.   Take the Cefdinir twice daily x 10 days.  If you start having fevers, vomiting, or feeling worse come back and see Abigail George.  It was good to see you today!

## 2013-07-09 NOTE — Progress Notes (Signed)
Abigail George is a 28 y.o. female who presents to Urgent Care today with complaints of sore throat:  1.  Sore throat:  Started on Wed AM.  Describes raspy, scratching, painful when swallowing.  Starting yesterday, began with sinus congestion R>L and sinus drainage. Drainage is greenish purulence. No fevers or chills.  No nausea or vomiting.  No abdominal pain.    Has had some cough in AM today, coughing up greenish mucus.  No sick contacts.   PMH reviewed.  Past Medical History  Diagnosis Date  . Anxiety   . Migraines 12/2007  . Fibrocystic breast changes    No past surgical history on file.  Medications reviewed. Current Outpatient Prescriptions  Medication Sig Dispense Refill  . ALPRAZolam (XANAX) 0.5 MG tablet Take 1 tablet (0.5 mg total) by mouth as needed for sleep.  30 tablet  0  . Calcium Carbonate-Vitamin D (CALCIUM + D PO) Take by mouth daily.      . fluticasone (FLONASE) 50 MCG/ACT nasal spray Place 1-2 sprays into both nostrils daily.  16 g  11  . hydrocodone-acetaminophen (LORCET-HD) 5-500 MG per capsule Take 1 capsule by mouth every 6 (six) hours as needed.      . Levonorgestrel (SKYLA) 13.5 MG IUD by Intrauterine route.      . methylphenidate (METADATE CD) 40 MG CR capsule Take 40 mg by mouth every morning.      . Multiple Vitamins-Minerals (MULTIVITAMIN PO) Take by mouth daily.      . ondansetron (ZOFRAN ODT) 4 MG disintegrating tablet Take 1 tablet (4 mg total) by mouth every 8 (eight) hours as needed for nausea.  20 tablet  0  . SUMAtriptan (IMITREX) 100 MG tablet Take 100 mg by mouth every 2 (two) hours as needed.      . cefdinir (OMNICEF) 300 MG capsule Take 1 capsule (300 mg total) by mouth 2 (two) times daily.  20 capsule  0   No current facility-administered medications for this visit.    ROS as above otherwise neg.  No chest pain, palpitations, SOB, Fever, Chills, Abd pain, N/V/D.   Physical Exam:  BP 108/60  Pulse 69  Temp(Src) 98 F (36.7 C) (Oral)   Resp 14  Ht 5\' 9"  (1.753 m)  Wt 155 lb 9.6 oz (70.58 kg)  BMI 22.97 kg/m2  SpO2 99%  LMP 06/18/2013 Gen:  Patient sitting on exam table, appears stated age in no acute distress Head: Normocephalic atraumatic Eyes: EOMI, PERRL, sclera and conjunctiva non-erythematous Ears:  Canals clear bilaterally.  TMs pearly gray bilaterally without erythema or bulging.  Does have scarring TM's noted BL. Nose:  Nasal turbinates grossly enlarged bilaterally. Some exudates noted. Tender to palpation of maxillary sinus Right side. Mouth: S/p tonsillectomy.  Some posterior oropharyngeal erythema noted.  No exudates.   Neck: No cervical lymphadenopathy noted.  No neck stiffness. Heart:  RRR, no murmurs auscultated. Pulm:  Clear to auscultation bilaterally with good air movement.  No wheezes or rales noted.    Results for orders placed in visit on 07/09/13  POCT RAPID STREP A (OFFICE)      Result Value Ref Range   Rapid Strep A Screen Negative  Negative     Assessment and Plan:  1.  Sinusitis: - negative strep, patient asked to be tested as her husband has recurrent strep infections.  Very low risk, no need to send for strep culture.  - plan to treat with Cefdinir - warning precautions provided.

## 2013-07-18 ENCOUNTER — Ambulatory Visit: Payer: BC Managed Care – PPO | Admitting: Obstetrics & Gynecology

## 2013-07-19 ENCOUNTER — Ambulatory Visit (INDEPENDENT_AMBULATORY_CARE_PROVIDER_SITE_OTHER): Payer: BC Managed Care – PPO | Admitting: Certified Nurse Midwife

## 2013-07-19 ENCOUNTER — Encounter: Payer: Self-pay | Admitting: Certified Nurse Midwife

## 2013-07-19 VITALS — BP 106/70 | HR 70 | Resp 16 | Ht 68.5 in | Wt 155.0 lb

## 2013-07-19 DIAGNOSIS — Z0000 Encounter for general adult medical examination without abnormal findings: Secondary | ICD-10-CM

## 2013-07-19 DIAGNOSIS — Z01419 Encounter for gynecological examination (general) (routine) without abnormal findings: Secondary | ICD-10-CM

## 2013-07-19 NOTE — Patient Instructions (Signed)
General topics  Next pap or exam is  due in 1 year Take a Women's multivitamin Take 1200 mg. of calcium daily - prefer dietary If any concerns in interim to call back  Breast Self-Awareness Practicing breast self-awareness may pick up problems early, prevent significant medical complications, and possibly save your life. By practicing breast self-awareness, you can become familiar with how your breasts look and feel and if your breasts are changing. This allows you to notice changes early. It can also offer you some reassurance that your breast health is good. One way to learn what is normal for your breasts and whether your breasts are changing is to do a breast self-exam. If you find a lump or something that was not present in the past, it is best to contact your caregiver right away. Other findings that should be evaluated by your caregiver include nipple discharge, especially if it is bloody; skin changes or reddening; areas where the skin seems to be pulled in (retracted); or new lumps and bumps. Breast pain is seldom associated with cancer (malignancy), but should also be evaluated by a caregiver. BREAST SELF-EXAM The best time to examine your breasts is 5 7 days after your menstrual period is over.  ExitCare Patient Information 2013 [employer]Care[employer].   Exercise to Stay Healthy Exercise helps you become and stay healthy. EXERCISE IDEAS AND TIPS Choose exercises that:  You enjoy.  Fit into your day. You do not need to exercise really hard to be healthy. You can do exercises at a slow or medium level and stay healthy. You can:  Stretch before and after working out.  Try yoga, Pilates, or tai chi.  Lift weights.  Walk fast, swim, jog, run, climb stairs, bicycle, dance, or rollerskate.  Take aerobic classes. Exercises that burn about 150 calories:  Running 1  miles in 15 minutes.  Playing volleyball for 45 to 60 minutes.  Washing and waxing a car for 45 to 60  minutes.  Playing touch football for 45 minutes.  Walking 1  miles in 35 minutes.  Pushing a stroller 1  miles in 30 minutes.  Playing basketball for 30 minutes.  Raking leaves for 30 minutes.  Bicycling 5 miles in 30 minutes.  Walking 2 miles in 30 minutes.  Dancing for 30 minutes.  Shoveling snow for 15 minutes.  Swimming laps for 20 minutes.  Walking up stairs for 15 minutes.  Bicycling 4 miles in 15 minutes.  Gardening for 30 to 45 minutes.  Jumping rope for 15 minutes.  Washing windows or floors for 45 to 60 minutes. Document Released: 02/08/2010 Document Revised: 03/31/2011 Document Reviewed: 02/08/2010 ExitCare Patient Information 2013 ExitCare[employer].   Other topics ( that may be useful information):    Sexually Transmitted Disease Sexually transmitted disease (STD) refers to any infection that is passed from person to person during sexual activity. This may happen by way of saliva, semen, blood, vaginal mucus, or urine. Common STDs include:  Gonorrhea.  Chlamydia.  Syphilis.  HIV/AIDS.  Genital herpes.  Hepatitis B and C.  Trichomonas.  Human papillomavirus (HPV).  Pubic lice. CAUSES  An STD may be spread by bacteria, virus, or parasite. A person can get an STD by:  Sexual intercourse with an infected person.  Sharing sex toys with an infected person.  Sharing needles with an infected person.  Having intimate contact with the genitals, mouth, or rectal areas of an infected person. SYMPTOMS  Some people may not have any symptoms, but   they can still pass the infection to others. Different STDs have different symptoms. Symptoms include:  Painful or bloody urination.  Pain in the pelvis, abdomen, vagina, anus, throat, or eyes.  Skin rash, itching, irritation, growths, or sores (lesions). These usually occur in the genital or anal area.  Abnormal vaginal discharge.  Penile discharge in men.  Soft, flesh-colored skin growths in the  genital or anal area.  Fever.  Pain or bleeding during sexual intercourse.  Swollen glands in the groin area.  Yellow skin and eyes (jaundice). This is seen with hepatitis. DIAGNOSIS  To make a diagnosis, your caregiver may:  Take a medical history.  Perform a physical exam.  Take a specimen (culture) to be examined.  Examine a sample of discharge under a microscope.  Perform blood test TREATMENT   Chlamydia, gonorrhea, trichomonas, and syphilis can be cured with antibiotic medicine.  Genital herpes, hepatitis, and HIV can be treated, but not cured, with prescribed medicines. The medicines will lessen the symptoms.  Genital warts from HPV can be treated with medicine or by freezing, burning (electrocautery), or surgery. Warts may come back.  HPV is a virus and cannot be cured with medicine or surgery.However, abnormal areas may be followed very closely by your caregiver and may be removed from the cervix, vagina, or vulva through office procedures or surgery. If your diagnosis is confirmed, your recent sexual partners need treatment. This is true even if they are symptom-free or have a negative culture or evaluation. They should not have sex until their caregiver says it is okay. HOME CARE INSTRUCTIONS  All sexual partners should be informed, tested, and treated for all STDs.  Take your antibiotics as directed. Finish them even if you start to feel better.  Only take over-the-counter or prescription medicines for pain, discomfort, or fever as directed by your caregiver.  Rest.  Eat a balanced diet and drink enough fluids to keep your urine clear or pale yellow.  Do not have sex until treatment is completed and you have followed up with your caregiver. STDs should be checked after treatment.  Keep all follow-up appointments, Pap tests, and blood tests as directed by your caregiver.  Only use latex condoms and water-soluble lubricants during sexual activity. Do not use  petroleum jelly or oils.  Avoid alcohol and illegal drugs.  Get vaccinated for HPV and hepatitis. If you have not received these vaccines in the past, talk to your caregiver about whether one or both might be right for you.  Avoid risky sex practices that can break the skin. The only way to avoid getting an STD is to avoid all sexual activity.Latex condoms and dental dams (for oral sex) will help lessen the risk of getting an STD, but will not completely eliminate the risk. SEEK MEDICAL CARE IF:   You have a fever.  You have any new or worsening symptoms. Document Released: 03/29/2002 Document Revised: 03/31/2011 Document Reviewed: 04/05/2010 [facility] Patient Information 2013 [employer].    Domestic Abuse You are being battered or abused if someone close to you hits, pushes, or physically hurts you in any way. You also are being abused if you are forced into activities. You are being sexually abused if you are forced to have sexual contact of any kind. You are being emotionally abused if you are made to feel worthless or if you are constantly threatened. It is important to remember that help is available. No one has the right to abuse you. PREVENTION OF FURTHER  ABUSE  Learn the warning signs of danger. This varies with situations but may include: the use of alcohol, threats, isolation from friends and family, or forced sexual contact. Leave if you feel that violence is going to occur.  If you are attacked or beaten, report it to the police so the abuse is documented. You do not have to press charges. The police can protect you while you or the attackers are leaving. Get the officer's name and badge number and a copy of the report.  Find someone you can trust and tell them what is happening to you: your caregiver, a nurse, clergy member, close friend or family member. Feeling ashamed is natural, but remember that you have done nothing wrong. No one deserves abuse. Document Released:  01/04/2000 Document Revised: 03/31/2011 Document Reviewed: 03/14/2010 ExitCare Patient Information 2013 [employer]Care[employer].    How Much is Too Much Alcohol? Drinking too much alcohol can cause injury, accidents, and health problems. These types of problems can include:   Car crashes.  Falls.  Family fighting (domestic violence).  Drowning.  Fights.  Injuries.  Burns.  Damage to certain organs.  Having a baby with birth defects. ONE DRINK CAN BE TOO MUCH WHEN YOU ARE:  Working.  Pregnant or breastfeeding.  Taking medicines. Ask your doctor.  Driving or planning to drive. If you or someone you know has a drinking problem, get help from a doctor.  Document Released: 11/02/2008 Document Revised: 03/31/2011 Document Reviewed: 11/02/2008 ExitCare Patient Information 2013 [employer]Care[employer].   Smoking Hazards Smoking cigarettes is extremely bad for your health. Tobacco smoke has over 200 known poisons in it. There are over 60 chemicals in tobacco smoke that cause cancer. Some of the chemicals found in cigarette smoke include:   Cyanide.  Benzene.  Formaldehyde.  Methanol (wood alcohol).  Acetylene (fuel used in welding torches).  Ammonia. Cigarette smoke also contains the poisonous gases nitrogen oxide and carbon monoxide.  Cigarette smokers have an increased risk of many serious medical problems and Smoking causes approximately:  90% of all lung cancer deaths in men.  80% of all lung cancer deaths in women.  90% of deaths from chronic obstructive lung disease. Compared with nonsmokers, smoking increases the risk of:  Coronary heart disease by 2 to 4 times.  Stroke by 2 to 4 times.  Men developing lung cancer by 23 times.  Women developing lung cancer by 13 times.  Dying from chronic obstructive lung diseases by 12 times.  . Smoking is the most preventable cause of death and disease in our society.  WHY IS SMOKING ADDICTIVE?  Nicotine is the chemical  agent in tobacco that is capable of causing addiction or dependence.  When you smoke and inhale, nicotine is absorbed rapidly into the bloodstream through your lungs. Nicotine absorbed through the lungs is capable of creating a powerful addiction. Both inhaled and non-inhaled nicotine may be addictive.  Addiction studies of cigarettes and spit tobacco show that addiction to nicotine occurs mainly during the teen years, when young people begin using tobacco products. WHAT ARE THE BENEFITS OF QUITTING?  There are many health benefits to quitting smoking.   Likelihood of developing cancer and heart disease decreases. Health improvements are seen almost immediately.  Blood pressure, pulse rate, and breathing patterns start returning to normal soon after quitting. QUITTING SMOKING   American Lung Association - 1-800-LUNGUSA  American Cancer Society - 1-800-ACS-2345 Document Released: 02/14/2004 Document Revised: 03/31/2011 Document Reviewed: 10/18/2008 ExitCare Patient Information 2013 ExitCare,   LLC.   Stress Management Stress is a state of physical or mental tension that often results from changes in your life or normal routine. Some common causes of stress are:  Death of a loved one.  Injuries or severe illnesses.  Getting fired or changing jobs.  Moving into a new home. Other causes may be:  Sexual problems.  Business or financial losses.  Taking on a large debt.  Regular conflict with someone at home or at work.  Constant tiredness from lack of sleep. It is not just bad things that are stressful. It may be stressful to:  Win the lottery.  Get married.  Buy a new car. The amount of stress that can be easily tolerated varies from person to person. Changes generally cause stress, regardless of the types of change. Too much stress can affect your health. It may lead to physical or emotional problems. Too little stress (boredom) may also become stressful. SUGGESTIONS TO  REDUCE STRESS:  Talk things over with your family and friends. It often is helpful to share your concerns and worries. If you feel your problem is serious, you may want to get help from a professional counselor.  Consider your problems one at a time instead of lumping them all together. Trying to take care of everything at once may seem impossible. List all the things you need to do and then start with the most important one. Set a goal to accomplish 2 or 3 things each day. If you expect to do too many in a single day you will naturally fail, causing you to feel even more stressed.  Do not use alcohol or drugs to relieve stress. Although you may feel better for a short time, they do not remove the problems that caused the stress. They can also be habit forming.  Exercise regularly - at least 3 times per week. Physical exercise can help to relieve that "uptight" feeling and will relax you.  The shortest distance between despair and hope is often a good night's sleep.  Go to bed and get up on time allowing yourself time for appointments without being rushed.  Take a short "time-out" period from any stressful situation that occurs during the day. Close your eyes and take some deep breaths. Starting with the muscles in your face, tense them, hold it for a few seconds, then relax. Repeat this with the muscles in your neck, shoulders, hand, stomach, back and legs.  Take good care of yourself. Eat a balanced diet and get plenty of rest.  Schedule time for having fun. Take a break from your daily routine to relax. HOME CARE INSTRUCTIONS   Call if you feel overwhelmed by your problems and feel you can no longer manage them on your own.  Return immediately if you feel like hurting yourself or someone else. Document Released: 07/02/2000 Document Revised: 03/31/2011 Document Reviewed: 02/22/2007 ExitCare Patient Information 2013 ExitCare, LLC.   

## 2013-07-19 NOTE — Progress Notes (Signed)
28 y.o. G0P0 Married Caucasian Fe here for annual exam. Periods random with light to moderate with 6 day duration and then 3 days. IUD working well. Happy with choice, no cramping!.Plans removal next year and anticipate pregnancy. [place] Currently having cold, no fever or chills. Coughing up clear mucous. Recent labs 2 weeks ago all normal with PCP. Patient just had fatigue from running. No other health issues today.  Patient's last menstrual period was 07/17/2013.          Sexually active: Yes.    The current method of family planning is IUD.    Exercising: Yes.    running & weights Smoker:  no  Health Maintenance: Pap: 05-04-12 neg MMG: 02-16-13 left breast u/s Bi-rads Category 2 benign Colonoscopy: none BMD:   none TDaP: 2009 Labs: none Self breast exam: done monthly   reports that she has never smoked. She has never used smokeless tobacco. She reports that she drinks alcohol. She reports that she does not use illicit drugs.  Past Medical History  Diagnosis Date  . Anxiety   . Migraines 12/2007  . Fibrocystic breast changes     History reviewed. No pertinent past surgical history.  Current Outpatient Prescriptions  Medication Sig Dispense Refill  . ALPRAZolam (XANAX) 0.5 MG tablet Take 1 tablet (0.5 mg total) by mouth as needed for sleep.  30 tablet  0  . CALCIUM PO Take by mouth daily.      . fluticasone (FLONASE) 50 MCG/ACT nasal spray Place 1-2 sprays into both nostrils daily.  16 g  11  . hydrocodone-acetaminophen (LORCET-HD) 5-500 MG per capsule Take 1 capsule by mouth every 6 (six) hours as needed.      . Levonorgestrel (SKYLA) 13.5 MG IUD by Intrauterine route.      . methylphenidate (METADATE CD) 40 MG CR capsule Take 40 mg by mouth every morning.      . Multiple Vitamins-Minerals (MULTIVITAMIN PO) Take by mouth daily.      . SUMAtriptan (IMITREX) 100 MG tablet Take 100 mg by mouth every 2 (two) hours as needed.       No current  facility-administered medications for this visit.    History reviewed. No pertinent family history.  ROS:  Pertinent items are noted in HPI.  Otherwise, a comprehensive ROS was negative.  Exam:   BP 106/70  Pulse 70  Resp 16  Ht 5' 8.5" (1.74 m)  Wt 155 lb (70.308 kg)  BMI 23.22 kg/m2  LMP 07/17/2013 Height: 5' 8.5" (174 cm)  Ht Readings from Last 3 Encounters:  07/19/13 5' 8.5" (1.74 m)  07/09/13 5\' 9"  (1.753 m)  01/12/13 5\' 9"  (1.753 m)    General appearance: alert, cooperative and appears stated age Head: Normocephalic, without obvious abnormality, atraumatic Neck: no adenopathy, supple, symmetrical, trachea midline and thyroid normal to inspection and palpation and non-palpable Lungs: clear to auscultation bilaterally Breasts: normal appearance, no masses or tenderness, No nipple retraction or dimpling, No nipple discharge or bleeding, No axillary or supraclavicular adenopathy Heart: regular rate and rhythm Abdomen: soft, non-tender; no masses,  no organomegaly Extremities: extremities normal, atraumatic, no cyanosis or edema Skin: Skin color, texture, turgor normal. No rashes or lesions Lymph nodes: Cervical, supraclavicular, and axillary nodes normal. No abnormal inguinal nodes palpated Neurologic: Grossly normal   Pelvic: External genitalia:  no lesions              Urethra:  normal appearing urethra with no masses,  tenderness or lesions              Bartholin's and Skene's: normal                 Vagina: normal appearing vagina with normal color and discharge, no lesions              Cervix: normal, non tender IUD string noted in cervix              Pap taken: No. Bimanual Exam:  Uterus:  normal size, contour, position, consistency, mobility, non-tender and anteverted              Adnexa: normal adnexa and no mass, fullness, tenderness               Rectovaginal: Confirms               Anus: deferred, normal appearance, no lesions  A:  Well Woman with normal  exam  Contraception Skyla IUD due for removal 04/08/11, plans to try for pregnancy with removal  P:   Reviewed health and wellness pertinent to exam  Aware of warning signs of IUD  Discussed Rubella screen today with planning or pregnancy.  WUJ:[id number]:Rubella screen  Pap smear not taken today  Will call in 3/16 for IUD removal   counseled on breast self exam, adequate intake of calcium and vitamin D, diet and exercise  return annually or prn  An After Visit Summary was printed and given to the patient.

## 2013-07-20 LAB — RUBELLA SCREEN: Rubella: 1.83 Index — ABNORMAL HIGH (ref <0.90)

## 2013-07-25 NOTE — Progress Notes (Signed)
Reviewed personally.  M. Suzanne Chrishun Scheer, MD.  

## 2013-12-06 ENCOUNTER — Ambulatory Visit (INDEPENDENT_AMBULATORY_CARE_PROVIDER_SITE_OTHER): Payer: BC Managed Care – PPO | Admitting: Family Medicine

## 2013-12-06 VITALS — BP 122/70 | HR 81 | Temp 97.5°F | Resp 16 | Ht 68.25 in | Wt 149.2 lb

## 2013-12-06 DIAGNOSIS — J0100 Acute maxillary sinusitis, unspecified: Secondary | ICD-10-CM

## 2013-12-06 DIAGNOSIS — G4489 Other headache syndrome: Secondary | ICD-10-CM

## 2013-12-06 DIAGNOSIS — Z23 Encounter for immunization: Secondary | ICD-10-CM

## 2013-12-06 MED ORDER — METHYLPREDNISOLONE (PAK) 4 MG PO TABS: ORAL_TABLET | ORAL | Status: DC

## 2013-12-06 MED ORDER — AZITHROMYCIN 250 MG PO TABS: ORAL_TABLET | ORAL | Status: DC

## 2013-12-06 NOTE — Progress Notes (Signed)
Chief Complaint:  Chief Complaint  Patient presents with  . Sinusitis    x 1 week   . Otalgia  . Headache  . Cough    HPI: Abigail George is a 28 y.o. female who is here for  1 week hx of sinus sxs , she ahs has a HA, ear pain bilaterally and also cough, npn productive, has been using floanse without releif. She has not been running for a week, [place] prior to this. She has scarring in her ears. She has not had fevers or chills. She has had sinus sxs in the past and was given omnicef both times and it worked. She ahs never had augmentin ot z pack before.   Past Medical History  Diagnosis Date  . Anxiety   . Migraines 12/2007  . Fibrocystic breast changes    History reviewed. No pertinent past surgical history. History   Social History  . Marital Status: Married    Spouse Name: N/A    Number of Children: N/A  . Years of Education: N/A   Social History Main Topics  . Smoking status: Never Smoker   . Smokeless tobacco: Never Used  . Alcohol Use: Yes     Comment: less than 1 a week  . Drug Use: No  . Sexual Activity:    Partners: Male    Birth Control/ Protection: IUD     Comment: skyla   Other Topics Concern  . None   Social History Narrative   History reviewed. No pertinent family history. No Known Allergies Prior to Admission medications   Medication Sig Start Date End Date Taking? Authorizing Provider  ALPRAZolam Prudy Feeler(XANAX) 0.5 MG tablet Take 1 tablet (0.5 mg total) by mouth as needed for sleep. 05/04/12  Yes Annamaria BootsMary Suzanne Miller, MD  CALCIUM PO Take by mouth daily.   Yes Historical Provider, MD  fluticasone (FLONASE) 50 MCG/ACT nasal spray Place 1-2 sprays into both nostrils daily. 01/12/13 01/12/14 Yes Sarah Harvie BridgeL Weber, PA-C  hydrocodone-acetaminophen (LORCET-HD) 5-500 MG per capsule Take 1 capsule by mouth every 6 (six) hours as needed.   Yes Historical Provider, MD  Levonorgestrel (SKYLA) 13.5  MG IUD by Intrauterine route.   Yes Historical Provider, MD  methylphenidate (METADATE CD) 40 MG CR capsule Take 40 mg by mouth every morning.   Yes Historical Provider, MD  Multiple Vitamins-Minerals (MULTIVITAMIN PO) Take by mouth daily.   Yes Historical Provider, MD  SUMAtriptan (IMITREX) 100 MG tablet Take 100 mg by mouth every 2 (two) hours as needed.   Yes Historical Provider, MD     ROS: The patient denies fevers, chills, night sweats, unintentional weight loss, chest pain, palpitations, wheezing, dyspnea on exertion, nausea, vomiting, abdominal pain, dysuria, hematuria, melena, numbness, weakness, or tingling.   All other systems have been reviewed and were otherwise negative with the exception of those mentioned in the HPI and as above.    PHYSICAL EXAM: Filed Vitals:   12/06/13 1632  BP: 122/70  Pulse: 81  Temp: 97.5 F (36.4 C)  Resp: 16   Filed Vitals:   12/06/13 1632  Height: 5' 8.25" (1.734 m)  Weight: 149 lb 3.2 oz (67.677 kg)   Body mass index is 22.51 kg/(m^2).  General: Alert, no acute distress HEENT:  Normocephalic, atraumatic, oropharynx patent. EOMI, PERRLA  TM nl, erythematous throat, No exudates. + boggy nares, + sinus tenderness. Cardiovascular:  Regular rate  and rhythm, no rubs murmurs or gallops.  No Carotid bruits, radial pulse intact. No pedal edema.  Respiratory: Clear to auscultation bilaterally.  No wheezes, rales, or rhonchi.  No cyanosis, no use of accessory musculature GI: No organomegaly, abdomen is soft and non-tender, positive bowel sounds.  No masses. Skin: No rashes. Neurologic: Facial musculature symmetric. Psychiatric: Patient is appropriate throughout our interaction. Lymphatic: No cervical lymphadenopathy Musculoskeletal: Gait intact.   LABS: Results for orders placed or performed in visit on 07/19/13  Rubella screen  Result Value Ref Range   Rubella 1.83 (H) <0.90 Index     EKG/XRAY:   Primary [facility]   ASSESSMENT/PLAN: Encounter Diagnoses  Name Primary?  . Acute maxillary sinusitis, recurrence not specified Yes  . Other headache syndrome   . Flu vaccine need    Flu vaccine given since afebrile Rx azithromycin and also medrol dose pack for tenderness Cont with flonase, if alllergy sxs then recommend otc anithistamine daily  F/u prn   Gross sideeffects, risk and benefits, and alternatives of medications d/w patient. Patient is aware that all medications have potential sideeffects and we are unable to predict every sideeffect or drug-drug interaction that may occur.  Ramadan Couey PHUONG, DO 12/06/2013 4:55 PM

## 2013-12-06 NOTE — Patient Instructions (Signed)

## 2014-04-21 ENCOUNTER — Other Ambulatory Visit: Payer: Self-pay | Admitting: Physician Assistant

## 2014-04-27 ENCOUNTER — Telehealth: Payer: Self-pay | Admitting: Obstetrics & Gynecology

## 2014-04-27 DIAGNOSIS — Z30432 Encounter for removal of intrauterine contraceptive device: Secondary | ICD-10-CM

## 2014-04-27 NOTE — Telephone Encounter (Signed)
Spoke with patient. Patient states that her IUD was due for removal in March. Patient would like to schedule IUD removal at this time. Would like to try for pregnancy. IUD removal scheduled for 05/16/14 at 3pm with Dr.Miller. Patient is agreeable to date and time. Patient will use BUM until IUD is removed. Order placed for precert.  Routing to provider for final review. Patient agreeable to disposition. Will close encounter

## 2014-04-27 NOTE — Telephone Encounter (Signed)
Patient says she was due to have skyla removed in March 2016. Patient has her aex 08/10/14. Patient is asking if it is okay to leave skyla in until July?

## 2014-05-16 ENCOUNTER — Encounter: Payer: Self-pay | Admitting: Obstetrics & Gynecology

## 2014-05-16 ENCOUNTER — Ambulatory Visit (INDEPENDENT_AMBULATORY_CARE_PROVIDER_SITE_OTHER): Payer: BC Managed Care – PPO | Admitting: Obstetrics & Gynecology

## 2014-05-16 VITALS — BP 120/80 | HR 88 | Resp 16 | Ht 68.5 in | Wt 155.0 lb

## 2014-05-16 DIAGNOSIS — Z0184 Encounter for antibody response examination: Secondary | ICD-10-CM

## 2014-05-16 DIAGNOSIS — Z30432 Encounter for removal of intrauterine contraceptive device: Secondary | ICD-10-CM

## 2014-05-16 HISTORY — PX: IUD REMOVAL: SHX5392

## 2014-05-16 NOTE — Progress Notes (Signed)
29 yrs Caucasian Married G0P0 here for [employer] Corporation[employer]yla removal.  Interested in beginning to try for pregnancy.  IUD needs to be removed due to being three years since insertion.  Not on PNVs yet.  D/W pt her desires.  She doesn't want to use condoms but wants to use natural family planning.  Pt and I discussed OPKs and keeping menstrual calendar.     Exam: WNWD WF, NAD Abdomen: soft non-tender Groin: no inguinal nodes palpated    Pelvic exam: normal external genitalia, vulva, vagina, cervix, uterus and adnexa.  IUD string seen.  Procedure: Speculum placed, cervix visualized.  IUD string visualized, grasp with ring forceps, with gentle traction IUD removed intact.  IUD shown to patient and discarded. Speculum removed.   Assessment: Skyla removal Contemplating pregnancy  Plan: Start PNV Rubella status Follow up for AEX, or with + UPT, or prn issues/problems

## 2014-05-17 LAB — RUBELLA SCREEN: RUBELLA: 2.11 {index} — AB (ref <0.90)

## 2014-05-19 ENCOUNTER — Telehealth: Payer: Self-pay

## 2014-05-19 NOTE — Telephone Encounter (Signed)
-----   Message from Patton SallesBrook E Amundson C Silva, MD sent at 05/18/2014  9:01 PM EDT ----- This is Dr. Edward JollySilva reviewing Dr. Rondel BatonMiller's inbox.  Patient has immunity to [place].

## 2014-05-19 NOTE — Telephone Encounter (Signed)
Left message to call Kaitlyn at [phone]. 

## 2014-05-19 NOTE — Telephone Encounter (Signed)
Spoke with patient. Results given. Patient is agreeable and verbalizes understanding.  Routing to provider for final review. Patient agreeable to disposition. Will close encounter.     

## 2014-05-21 ENCOUNTER — Ambulatory Visit (INDEPENDENT_AMBULATORY_CARE_PROVIDER_SITE_OTHER): Payer: BC Managed Care – PPO | Admitting: Physician Assistant

## 2014-05-21 VITALS — BP 100/80 | HR 67 | Temp 97.8°F | Ht 68.5 in | Wt 150.0 lb

## 2014-05-21 DIAGNOSIS — R1084 Generalized abdominal pain: Secondary | ICD-10-CM

## 2014-05-21 DIAGNOSIS — R197 Diarrhea, unspecified: Secondary | ICD-10-CM | POA: Diagnosis not present

## 2014-05-21 DIAGNOSIS — R112 Nausea with vomiting, unspecified: Secondary | ICD-10-CM

## 2014-05-21 LAB — POCT CBC
Granulocyte percent: 77.2 %G (ref 37–80)
HCT, POC: 48 % — AB (ref 37.7–47.9)
Hemoglobin: 16.3 g/dL — AB (ref 12.2–16.2)
Lymph, poc: 0.8 (ref 0.6–3.4)
MCH, POC: 30.9 pg (ref 27–31.2)
MCHC: 33.9 g/dL (ref 31.8–35.4)
MCV: 91 fL (ref 80–97)
MID (CBC): 0.2 (ref 0–0.9)
MPV: 7.5 fL (ref 0–99.8)
POC Granulocyte: 3.2 (ref 2–6.9)
POC LYMPH %: 18 % (ref 10–50)
POC MID %: 4.8 % (ref 0–12)
Platelet Count, POC: 191 10*3/uL (ref 142–424)
RBC: 5.27 M/uL (ref 4.04–5.48)
RDW, POC: 12.6 %
WBC: 4.2 10*3/uL — AB (ref 4.6–10.2)

## 2014-05-21 LAB — POCT URINALYSIS DIPSTICK
GLUCOSE UA: NEGATIVE
Leukocytes, UA: NEGATIVE
NITRITE UA: NEGATIVE
PROTEIN UA: 100
Spec Grav, UA: >=1.03
UROBILINOGEN UA: 0.2
pH, UA: 6.5

## 2014-05-21 MED ORDER — ONDANSETRON 4 MG PO TBDP: 4.0000 mg | ORAL_TABLET | Freq: Three times a day (TID) | ORAL | Status: DC | PRN

## 2014-05-21 NOTE — Patient Instructions (Addendum)
You were very dehydrated on urine sample today. We gave you 2 liters of IV fluids. Be sure to try to drink plenty of water over the next 48 hours.  Your white blood cell count was normal which is reassuring. You can take otc imodium if needed. Start by taking 4 mg initially then take 2 mg after each diarrhea episode. No more than 16 mg in 1 24 hour period.  Please take the zofran as needed.  If you're not improving in 48 hours, if you start feeling worse, having blood in the diarrhea, having severe abdominal pains, or having fevers that are persistent please come back to see [place] or go to the ER for a possible stool sample and further work up.    Viral Gastroenteritis Viral gastroenteritis is also known as stomach flu. This condition affects the stomach and intestinal tract. It can cause sudden diarrhea and vomiting. The illness typically lasts 3 to 8 days. Most people develop an immune response that eventually gets rid of the virus. While this natural response develops, the virus can make you quite ill. CAUSES  Many different viruses can cause gastroenteritis, such as rotavirus or noroviruses. You can catch one of these viruses by consuming contaminated food or water. You may also catch a virus by sharing utensils or other personal items with an infected person or by touching a contaminated surface. SYMPTOMS  The most common symptoms are diarrhea and vomiting. These problems can cause a severe loss of body fluids (dehydration) and a body salt (electrolyte) imbalance. Other symptoms may include:  Fever.  Headache.  Fatigue.  Abdominal pain. DIAGNOSIS  Your caregiver can usually diagnose viral gastroenteritis based on your symptoms and a physical exam. A stool sample may also be taken to test for the presence of viruses or other infections. TREATMENT  This illness typically goes away on its own. Treatments are aimed at rehydration. The most serious cases of viral gastroenteritis involve vomiting  so severely that you are not able to keep fluids down. In these cases, fluids must be given through an intravenous line (IV). HOME CARE INSTRUCTIONS   Drink enough fluids to keep your urine clear or pale yellow. Drink small amounts of fluids frequently and increase the amounts as tolerated.  Ask your caregiver for specific rehydration instructions.  Avoid:  Foods high in sugar.  Alcohol.  Carbonated drinks.  Tobacco.  Juice.  Caffeine drinks.  Extremely hot or cold fluids.  Fatty, greasy foods.  Too much intake of anything at one time.  Dairy products until 24 to 48 hours after diarrhea stops.  You may consume probiotics. Probiotics are active cultures of beneficial bacteria. They may lessen the amount and number of diarrheal stools in adults. Probiotics can be found in yogurt with active cultures and in supplements.  Wash your hands well to avoid spreading the virus.  Only take over-the-counter or prescription medicines for pain, discomfort, or fever as directed by your caregiver. Do not give aspirin to children. Antidiarrheal medicines are not recommended.  Ask your caregiver if you should continue to take your regular prescribed and over-the-counter medicines.  Keep all follow-up appointments as directed by your caregiver. SEEK IMMEDIATE MEDICAL CARE IF:   You are unable to keep fluids down.  You do not urinate at least once every 6 to 8 hours.  You develop shortness of breath.  You notice blood in your stool or vomit. This may look like coffee grounds.  You have abdominal pain that increases or  is concentrated in one small area (localized).  You have persistent vomiting or diarrhea.  You have a fever.  The patient is a child younger than 3 months, and he or she has a fever.  The patient is a child older than 3 months, and he or she has a fever and persistent symptoms.  The patient is a child older than 3 months, and he or she has a fever and symptoms  suddenly get worse.  The patient is a baby, and he or she has no tears when crying. MAKE SURE YOU:   Understand these instructions.  Will watch your condition.  Will get help right away if you are not doing well or get worse. Document Released: 01/06/2005 Document Revised: 03/31/2011 Document Reviewed: 10/23/2010 [facility] Patient Information 2015 [employer] [place]. This information is not intended to replace advice given to you by your health care provider. Make sure you discuss any questions you have with your health care provider.

## 2014-05-21 NOTE — Progress Notes (Signed)
Subjective:    Patient ID: Patty SermonsElizabeth S Lawrence, female    DOB: [birth year], 29 y.o.   MRN: [id number]  Chief Complaint  Patient presents with  . Diarrhea    x 2 days-has lost 5 pounds since then  . Emesis  . Abdominal Cramping  . Anorexia  . Fatigue  . Dehydration  . Headache   Patient Active Problem List   Diagnosis Date Noted  . Migraine 01/12/2013   Prior to Admission medications   Medication Sig Start Date End Date Taking? Authorizing Provider  ALPRAZolam Prudy Feeler(XANAX) 0.5 MG tablet Take 1 tablet (0.5 mg total) by mouth as needed for sleep. 05/04/12  Yes Jerene BearsMary S Miller, MD  fluticasone [facility](FLONASE) 50 MCG/ACT nasal spray USE 1-2 SPRAYS EACH NOSTRIL IN THE EVENING. 04/21/14  Yes Thao P Le, DO  hydrocodone-acetaminophen (LORCET-HD) 5-500 MG per capsule Take 1 capsule by mouth every 6 (six) hours as needed.   Yes Historical Provider, MD  methylphenidate (METADATE CD) 40 MG CR capsule Take 40 mg by mouth every morning.   Yes Historical Provider, MD  Prenatal Vit-Fe Fumarate-FA (PRENATAL MULTIVITAMIN) TABS tablet Take 1 tablet by mouth daily at 12 noon.   Yes Historical Provider, MD  SUMAtriptan (IMITREX) 100 MG tablet Take 100 mg by mouth every 2 (two) hours as needed.   Yes Historical Provider, MD   Medications, allergies, past medical history, surgical history, family history, social history and problem list reviewed and updated.  HPI  [age] yof presents with above sx.    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   Several hrs later (approx 38 hrs ago) diarrhea started. Has been persistent since then. Approx 10 episodes day. Non bloody. Has slightly lessened past 8 hrs. Persistent mild general abd pain described as cramping. No appetite. Has been drinking water ok and keeping down. One episode non bloody emesis yest, none today. HA yest which has resolved today. Chills at home past couple days. No fever.   No sick contacts. No  recent abx. Had IUD removed 5 days ago as planning to start trying to get preg. Denies vaginal dc, dyspareunia, itchiness, dysuria, freq, urgency.   Review of Systems See HPI. No cp, sob.     Objective:   Physical Exam  Constitutional: She is oriented to person, place, and time. She appears well-developed and well-nourished.  Non-toxic appearance. She does not have a sickly appearance. She does not appear ill. No distress.  BP 100/80 mmHg  Pulse 67  Temp(Src) 97.8 F (36.6 C) (Oral)  Ht 5' 8.5" (1.74 m)  Wt 150 lb (68.04 kg)  BMI 22.47 kg/m2  SpO2 99%  LMP 04/29/2014   Cardiovascular: Normal rate, regular rhythm and normal heart sounds.   Pulmonary/Chest: Effort normal and breath sounds normal.  Abdominal: Soft. Normal appearance and bowel sounds are normal. There is generalized tenderness. There is no rigidity, no rebound, no guarding, no CVA tenderness, no tenderness at McBurney's point and negative Murphy's sign.  Mild generalized abd ttp. No focal areas.   Neurological: She is alert and oriented to person, place, and time.  Psychiatric: She has a normal mood and affect. Her speech is normal and behavior is normal.   Results for orders placed or performed in visit on 05/21/14  POCT CBC  Result Value Ref Range   WBC 4.2 (A) 4.6 - 10.2 K/uL   Lymph, poc 0.8 0.6 - 3.4   POC LYMPH  PERCENT 18.0 10 - 50 %L   MID (cbc) 0.2 0 - 0.9   POC MID % 4.8 0 - 12 %M   POC Granulocyte 3.2 2 - 6.9   Granulocyte percent 77.2 37 - 80 %G   RBC 5.27 4.04 - 5.48 M/uL   Hemoglobin 16.3 (A) 12.2 - 16.2 g/dL   HCT, POC 16.1 (A) 09.6 - 47.9 %   MCV 91.0 80 - 97 fL   MCH, POC 30.9 27 - 31.2 pg   MCHC 33.9 31.8 - 35.4 g/dL   RDW, POC 04.5 %   Platelet Count, POC 191 142 - 424 K/uL   MPV 7.5 0 - 99.8 fL  POCT urinalysis dipstick  Result Value Ref Range   Color, UA yellow    Clarity, UA clear    Glucose, UA neg    Bilirubin, UA small    Ketones, UA trace    Spec Grav, UA >=1.030    Blood, UA  trace-lysed    pH, UA 6.5    Protein, UA 100    Urobilinogen, UA 0.2    Nitrite, UA neg    Leukocytes, UA Negative    2L IVF given in clinic after ua result.      Assessment & Plan:   48 yof presents with above sx.    Diarrhea - Plan: POCT CBC, POCT urinalysis dipstick Nausea and vomiting, vomiting of unspecified type - Plan: POCT CBC, POCT urinalysis dipstick Generalized abdominal pain - Plan: POCT CBC, POCT urinalysis dipstick --likely viral gastroenteritis with no bloody diarrhea, no fevers, no leukocytosis, no severe abd pain --zofran prn, discussed option of imodium, though will likely refrain at this time --dehydrated on ua --> 2L IVF in clinic, pt felt better afterward with more energy --hydrate at home --bland foods as tolerated --rtc if diarrhea persists 24-48 hrs, blood in stool, unable to keep fluids down, abd pain worsens, persistent fevers  Donnajean Lopes, PA-C Physician Assistant-Certified [facility]  05/21/2014 10:08 AM

## 2014-06-07 ENCOUNTER — Telehealth: Payer: Self-pay | Admitting: Obstetrics & Gynecology

## 2014-06-07 NOTE — Telephone Encounter (Signed)
Dr.Miller, letter for patient has been written. To your desk for review and signature for patient pick up. Thank you. Left message for patient advising letter has been written for Dr.Miller's review upon return to the office tomorrow and I will give her a call once letter is complete for pick up.

## 2014-06-07 NOTE — Telephone Encounter (Signed)
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 Pt states her school does not allow teachers to have any beverages or food on their desks during testing. Pt states she is sensative to migraines and requires these things to prevent one. Pt states last year Dr Hyacinth MeekerMiller wrote her a note to allow her these things during testing. Pt requests same letter this year. Pt states she needs to have water, a coke and a snack on her desk.  Pt requests letter for start of testing 06/16/14

## 2014-06-08 NOTE — Telephone Encounter (Signed)
Left message to call Daemien Fronczak at [phone]. 

## 2014-06-08 NOTE — Telephone Encounter (Signed)
Letter signed for sending and back on your desk.  Ok to close encounter.

## 2014-06-12 NOTE — Telephone Encounter (Signed)
Left message to call Kaitlyn at [phone]. 

## 2014-06-13 NOTE — Telephone Encounter (Signed)
Patient returned call. Advised letter ready for pick up. Patient agreeable.   Letter at front desk for pt pick up.  Routing to provider for final review. Patient agreeable to disposition. Will close encounter.

## 2014-08-01 ENCOUNTER — Encounter: Payer: Self-pay | Admitting: Obstetrics & Gynecology

## 2014-08-01 ENCOUNTER — Ambulatory Visit (INDEPENDENT_AMBULATORY_CARE_PROVIDER_SITE_OTHER): Payer: BC Managed Care – PPO | Admitting: Obstetrics & Gynecology

## 2014-08-01 VITALS — BP 120/60 | HR 80 | Resp 16 | Ht 68.5 in | Wt 159.0 lb

## 2014-08-01 DIAGNOSIS — Z0000 Encounter for general adult medical examination without abnormal findings: Secondary | ICD-10-CM | POA: Diagnosis not present

## 2014-08-01 DIAGNOSIS — Z124 Encounter for screening for malignant neoplasm of cervix: Secondary | ICD-10-CM | POA: Diagnosis not present

## 2014-08-01 DIAGNOSIS — Z01419 Encounter for gynecological examination (general) (routine) without abnormal findings: Secondary | ICD-10-CM | POA: Diagnosis not present

## 2014-08-01 LAB — CBC
HCT: 41.7 % (ref 36.0–46.0)
Hemoglobin: 14.5 g/dL (ref 12.0–15.0)
MCH: 32 pg (ref 26.0–34.0)
MCHC: 34.8 g/dL (ref 30.0–36.0)
MCV: 92.1 fL (ref 78.0–100.0)
MPV: 10.1 fL (ref 8.6–12.4)
Platelets: 229 10*3/uL (ref 150–400)
RBC: 4.53 MIL/uL (ref 3.87–5.11)
RDW: 13.6 % (ref 11.5–15.5)
WBC: 4.8 10*3/uL (ref 4.0–10.5)

## 2014-08-01 LAB — POCT URINALYSIS DIPSTICK
Bilirubin, UA: NEGATIVE
Blood, UA: NEGATIVE
GLUCOSE UA: NEGATIVE
Ketones, UA: NEGATIVE
Leukocytes, UA: NEGATIVE
Nitrite, UA: NEGATIVE
PH UA: 6
PROTEIN UA: NEGATIVE
Urobilinogen, UA: NEGATIVE

## 2014-08-01 NOTE — Addendum Note (Signed)
Addended by: Dion BodyBELTRAN, Scout Gumbs C on: 08/01/2014 01:25 PM   Modules accepted: Orders, SmartSet

## 2014-08-01 NOTE — Progress Notes (Signed)
29 y.o. G0P0 MarriedCaucasianF here for annual exam.  IUD removed 4/16.  Not actively trying but preventing with natural family planning.  Cycles are regular.  [place] [facility] [place] [facility] and then start trying after that time.  On PNV.  Had rubella testing in April and this was immune.  Pt aware.    D/W pt her medications in relation to pregnancy.  Knows to be off Imitrex, Xanax, and Ritalin as soon as has +UPT.  Patient's last menstrual period was 07/03/2014.          Sexually active: Yes.    The current method of family planning is none.    Exercising: Yes.    Running, weight training  Smoker:  no  Health Maintenance: Pap: 05/04/12 Neg History of abnormal Pap:  no MMG: 01/2013 US Left BIRADS2:Benign TDaP:  2009 Screening Labs: Here today, Hb today: pending, Urine today: Clear   reports that she has never smoked. She has never used smokeless tobacco. She reports that she drinks alcohol. She reports that she does not use illicit drugs.  Past Medical History  Diagnosis Date  . Anxiety   . Migraines 12/2007  . Fibrocystic breast changes     Past Surgical History  Procedure Laterality Date  . Iud removal  05/16/14    Current Outpatient Prescriptions  Medication Sig Dispense Refill  . ALPRAZolam (XANAX) 0.5 MG tablet Take 1 tablet (0.5 mg total) by mouth as needed for sleep. 30 tablet 0  . cetirizine (ZYRTEC) 10 MG tablet Take 10 mg by mouth daily.    . fluticasone (FLONASE) 50 MCG/ACT nasal spray USE 1-2 SPRAYS EACH NOSTRIL IN THE EVENING. 16 g 7  . hydrocodone-acetaminophen (LORCET-HD) 5-500 MG per capsule Take 1 capsule by mouth every 6 (six) hours as needed.    . methylphenidate (METADATE CD) 40 MG CR capsule Take 40 mg by mouth every morning.    . phenylephrine 0.5 % SOLN 15 mL, lidocaine 1 % SOLN 15 mL 1 application as needed.    . Prenatal Vit-Fe Fumarate-FA (PRENATAL MULTIVITAMIN) TABS tablet Take 1 tablet by mouth daily at 12 noon.    . SUMAtriptan  (IMITREX) 100 MG tablet Take 100 mg by mouth every 2 (two) hours as needed.    . ondansetron (ZOFRAN ODT) 4 MG disintegrating tablet Take 1 tablet (4 mg total) by mouth every 8 (eight) hours as needed for nausea or vomiting. (Patient not taking: Reported on 08/01/2014) 20 tablet 0   No current facility-administered medications for this visit.    History reviewed. No pertinent family history.  ROS:  Pertinent items are noted in HPI.  Otherwise, a comprehensive ROS was negative.  Exam:   BP 120/60 mmHg  Pulse 80  Resp 16  Ht 5' 8.5" (1.74 m)  Wt 159 lb (72.122 kg)  BMI 23.82 kg/m2  LMP 07/03/2014    Height: 5' 8.5" (174 cm)  Ht Readings from Last 3 Encounters:  08/01/14 5' 8.5" (1.74 m)  05/21/14 5' 8.5" (1.74 m)  05/16/14 5' 8.5" (1.74 m)    General appearance: alert, cooperative and appears stated age Head: Normocephalic, without obvious abnormality, atraumatic Neck: no adenopathy, supple, symmetrical, trachea midline and thyroid normal to inspection and palpation Lungs: clear to auscultation bilaterally Breasts: normal appearance, no masses or tenderness Heart: regular rate and rhythm Abdomen: soft, non-tender; bowel sounds normal; no masses,  no organomegaly Extremities: extremities normal, atraumatic, no cyanosis or edema Skin: Skin color, texture, turgor normal.  No rashes or lesions Lymph nodes: Cervical, supraclavicular, and axillary nodes normal. No abnormal inguinal nodes palpated Neurologic: Grossly normal   Pelvic: External genitalia:  no lesions              Urethra:  normal appearing urethra with no masses, tenderness or lesions              Bartholins and Skenes: normal                 Vagina: normal appearing vagina with normal color and discharge, no lesions              Cervix: no lesions              Pap taken: Yes.   Bimanual Exam:  Uterus:  normal size, contour, position, consistency, mobility, non-tender              Adnexa: normal adnexa and no mass,  fullness, tenderness               Rectovaginal: Confirms               Anus:  normal sphincter tone, no lesions  Chaperone was present for exam.  A:  Well Woman with normal exam H/O migraines with aura Seasonal allergies with sinus issues H/O [place] removal 05/16/14  P: Initiation of breast cancer screening discussed Pap obtained today Family hx of cervical cancer in MGM CBC pending return annually or prn

## 2014-08-02 LAB — IPS PAP TEST WITH REFLEX TO HPV

## 2014-08-03 ENCOUNTER — Telehealth: Payer: Self-pay

## 2014-08-03 NOTE — Telephone Encounter (Signed)
Lmtcb//kn 

## 2014-08-03 NOTE — Telephone Encounter (Signed)
-----   Message from Jerene BearsMary S Miller, MD sent at 08/02/2014  9:45 AM EDT ----- Inform CBC normal--specifically hb is in normal range.  Pap pending.  Can hold until that is back if desired.

## 2014-09-11 NOTE — Telephone Encounter (Signed)
Patient notified of all results.//kn 

## 2014-10-25 ENCOUNTER — Ambulatory Visit (INDEPENDENT_AMBULATORY_CARE_PROVIDER_SITE_OTHER): Payer: BC Managed Care – PPO | Admitting: Physician Assistant

## 2014-10-25 VITALS — BP 110/80 | HR 64 | Temp 97.8°F | Resp 18 | Ht 68.0 in | Wt 156.5 lb

## 2014-10-25 DIAGNOSIS — R591 Generalized enlarged lymph nodes: Secondary | ICD-10-CM

## 2014-10-25 DIAGNOSIS — R599 Enlarged lymph nodes, unspecified: Secondary | ICD-10-CM | POA: Diagnosis not present

## 2014-10-25 LAB — POCT SEDIMENTATION RATE: POCT SED RATE: 3 mm/h (ref 0–22)

## 2014-10-25 LAB — POCT RAPID STREP A (OFFICE): Rapid Strep A Screen: NEGATIVE

## 2014-10-25 LAB — POCT CBC
GRANULOCYTE PERCENT: 68.3 % (ref 37–80)
HEMATOCRIT: 43.9 % (ref 37.7–47.9)
Hemoglobin: 14.2 g/dL (ref 12.2–16.2)
LYMPH, POC: 2.1 (ref 0.6–3.4)
MCH, POC: 29.5 pg (ref 27–31.2)
MCHC: 32.3 g/dL (ref 31.8–35.4)
MCV: 91.3 fL (ref 80–97)
MID (CBC): 0.1 (ref 0–0.9)
MPV: 7.5 fL (ref 0–99.8)
POC GRANULOCYTE: 4.7 (ref 2–6.9)
POC LYMPH %: 30.2 % (ref 10–50)
POC MID %: 1.5 % (ref 0–12)
Platelet Count, POC: 224 10*3/uL (ref 142–424)
RBC: 4.81 M/uL (ref 4.04–5.48)
RDW, POC: 12.4 %
WBC: 6.9 10*3/uL (ref 4.6–10.2)

## 2014-10-25 MED ORDER — NAPROXEN 500 MG PO TABS: 500.0000 mg | ORAL_TABLET | Freq: Two times a day (BID) | ORAL | Status: DC

## 2014-10-25 NOTE — Progress Notes (Signed)
10/25/2014 at 9:26 PM  Abigail George / DOB: Jun 08, 1985 / MRN: [id number]  The patient has Migraine on her problem list.  SUBJECTIVE  Abigail George is a 29 y.o. well appearing female presenting for the chief complaint of right sided tonsillar swelling that started this morning and she reports that her neck "is huge."  Reports mild sore throat at this time, denies nasal congestion, cough, ear pain.  She reports the gland is mildly tender.  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  She has not tried anything for this problem.  Denies fever, chills, and night sweats.    She  has a past medical history of Anxiety; Migraines (12/2007); and Fibrocystic breast changes.    Medications reviewed and updated by myself where necessary, and exist elsewhere in the encounter.   Ms. Haggar has No Known Allergies. She  reports that she has never smoked. She has never used smokeless tobacco. She reports that she drinks alcohol. She reports that she does not use illicit drugs. She  reports that she currently engages in sexual activity and has had female partners. She reports using the following method of birth control/protection: Rhythm. The patient  has past surgical history that includes IUD removal (05/16/14).  Her family history is not on file.  Review of Systems  Constitutional: Negative for fever and chills.  Respiratory: Negative for shortness of breath.   Cardiovascular: Negative for chest pain.  Gastrointestinal: Negative for nausea and abdominal pain.  Genitourinary: Negative.   Skin: Negative for rash.  Neurological: Negative for dizziness and headaches.    OBJECTIVE  Her  height is  (1.727 m) and weight is 156 lb 8 oz (70.988 kg). Her oral temperature is 97.8 F (36.6 C). Her blood pressure is 110/80 and her pulse is 64. Her respiration is 18 and oxygen saturation is 99%.  The patient's body mass index is 23.8 kg/(m^2).  Physical  Exam  Constitutional: She is oriented to person, place, and time. She appears well-developed and well-nourished. No distress.  Eyes: EOM are normal. Pupils are equal, round, and reactive to light.  Cardiovascular: Normal rate and regular rhythm.   Respiratory: Effort normal and breath sounds normal. No respiratory distress.  GI: She exhibits no distension.  Lymphadenopathy:       Head (right side): Tonsillar (Mild swellling and tendernes.  ) adenopathy present. No submental, no submandibular, no preauricular, no posterior auricular and no occipital adenopathy present.       Head (left side): No submental, no submandibular, no tonsillar, no preauricular, no posterior auricular and no occipital adenopathy present.    She has no cervical adenopathy.  Neurological: She is alert and oriented to person, place, and time.  Skin: Skin is warm and dry. She is not diaphoretic.  Psychiatric: She has a normal mood and affect. Her behavior is normal. Judgment and thought content normal.    Results for orders placed or performed in visit on 10/25/14 (from the past 24 hour(s))  POCT CBC     Status: None   Collection Time: 10/25/14  7:10 PM  Result Value Ref Range   WBC 6.9 4.6 - 10.2 K/uL   Lymph, poc 2.1 0.6 - 3.4   POC LYMPH PERCENT 30.2 10 - 50 %L   MID (cbc) 0.1 0 - 0.9   POC MID % 1.5 0 - 12 %M   POC Granulocyte 4.7 2 - 6.9  Granulocyte percent 68.3 37 - 80 %G   RBC 4.81 4.04 - 5.48 M/uL   Hemoglobin 14.2 12.2 - 16.2 g/dL   HCT, POC 40.9 81.1 - 47.9 %   MCV 91.3 80 - 97 fL   MCH, POC 29.5 27 - 31.2 pg   MCHC 32.3 31.8 - 35.4 g/dL   RDW, POC 91.4 %   Platelet Count, POC 224 142 - 424 K/uL   MPV 7.5 0 - 99.8 fL  POCT rapid strep A     Status: None   Collection Time: 10/25/14  7:11 PM  Result Value Ref Range   Rapid Strep A Screen Negative Negative  POCT SEDIMENTATION RATE     Status: None   Collection Time: 10/25/14  8:11 PM  Result Value Ref Range   POCT SED RATE 3 0 - 22 mm/hr     ASSESSMENT & PLAN  Duana was seen today for lymphadenopathy.  Diagnoses and all orders for this visit:  Lymphadenopathy of head and neck: Work up negative thus far.  Will treat with NSAID for now and await throat culture.  Advised that if this problem dose not resolve within the next 15 days will proceed to imaging.   -     POCT rapid strep A -     Culture, Group A Strep -     POCT SEDIMENTATION RATE -     POCT CBC -     naproxen (NAPROSYN) 500 MG tablet; Take 1 tablet (500 mg total) by mouth 2 (two) times daily with a meal.    The patient was advised to call or come back to clinic if she does not see an improvement in symptoms, or worsens with the above plan.   Deliah Boston, MHS, PA-C [facility] 10/25/2014 9:26 PM

## 2014-10-26 ENCOUNTER — Telehealth: Payer: Self-pay

## 2014-10-26 NOTE — Telephone Encounter (Signed)
Please advise 

## 2014-10-26 NOTE — Telephone Encounter (Signed)
Patient called back wanting to know the status of her phone call earlier today..  Advised her we sent the message to a PA and waiting on response.

## 2014-10-26 NOTE — Telephone Encounter (Addendum)
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

## 2014-10-28 LAB — CULTURE, GROUP A STREP

## 2014-12-28 ENCOUNTER — Telehealth: Payer: Self-pay | Admitting: Obstetrics & Gynecology

## 2014-12-28 NOTE — Telephone Encounter (Signed)
Left message to call Kaitlyn at [phone]. 

## 2014-12-28 NOTE — Telephone Encounter (Signed)
Patient's husband, Trellis MomentSpencer Drakeford (DPR on file to share PHI), called requesting to schedule an appointment today, if possible, to verify the patient's positive urine pregnancy test. 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

## 2014-12-28 NOTE — Telephone Encounter (Signed)
Patient's husband Karleen HampshireSpencer return call. Before I was able to pick up the line Spencer hung up as he was walking into a meeting per [employer] Mutual LifeStarla Curl. Returned call to Mr.Tinner and left message to call Kaitlyn at [phone].

## 2014-12-28 NOTE — Telephone Encounter (Signed)
Spoke with patient's husband, Karleen HampshireSpencer, okay per designated party release form.   He states patient LMP 11/25/14. No problems current, no vaginal bleeding or pain. Patient having some morning sickness but is at work and unable to talk on the phone.  Office visit requested for afternoon appointment. Scheduled with Dr. Hyacinth MeekerMiller for 12/29/14 at 1530 with Dr. Hyacinth MeekerMiller. Agreeable to appointment. Advised to call back with any questions or concerns prior to appointment. Patient had Skly[place] removed 04/2014 and were using natural family planning for birth control.   Routing to provider for final review. Patient agreeable to disposition. Will close encounter.

## 2014-12-29 ENCOUNTER — Ambulatory Visit (INDEPENDENT_AMBULATORY_CARE_PROVIDER_SITE_OTHER): Payer: BC Managed Care – PPO | Admitting: Obstetrics & Gynecology

## 2014-12-29 ENCOUNTER — Encounter: Payer: Self-pay | Admitting: Obstetrics & Gynecology

## 2014-12-29 VITALS — BP 118/78 | HR 72 | Resp 16 | Ht 68.0 in | Wt 155.0 lb

## 2014-12-29 DIAGNOSIS — N926 Irregular menstruation, unspecified: Secondary | ICD-10-CM | POA: Diagnosis not present

## 2014-12-29 LAB — POCT URINE PREGNANCY: Preg Test, Ur: POSITIVE — AB

## 2014-12-29 NOTE — Progress Notes (Signed)
Patient scheduled for Pelvic ultrasound with Dr. Hyacinth MeekerMiller for 01/04/15 at 1600 with consult at 1630. Advised will be contacted to discuss insurance benefit for ultrasound. Patient agreeable.

## 2014-12-29 NOTE — Progress Notes (Signed)
GYNECOLOGY  VISIT   HPI: 29 y.o. Married Caucasian female G1P0 here for confirmation of pregnancy.  LMP 11/25/14.    [facility]  She is already having nausea and some fatigue.  She is having some increased moodiness as well.    For her migraine, she has lidocaine nasal spray and hydrocodone.  Has needed this five times already.  Anxious about narcotic addiction with child.  D/W pt careful use but I expect her headaches will level out as most individuals do after the first trimester.  She's just got to get back these first few weeks.  No birth defect association reviewed.  No cats in the home.  Tdap 02/03/2007.  Had chicken pox.  Aware flu vaccine safe and recommended.   Fish/shellfish/mercury discuss.  Patient does not eat much fish. Unpasteurized cheese/juices discussed.  Nitrites in foods disucssed.  Exercise and intercourse discussed.  Pt is an avid runner.     GYNECOLOGIC HISTORY: Patient's last menstrual period was 11/25/2014.   Patient Active Problem List   Diagnosis Date Noted  . Migraine 01/12/2013    Past Medical History  Diagnosis Date  . Anxiety   . Migraines 12/2007  . Fibrocystic breast changes     Past Surgical History  Procedure Laterality Date  . Iud removal  05/16/14    Current Outpatient Prescriptions  Medication Sig Dispense Refill  . phenylephrine 0.5 % SOLN 15 mL, lidocaine 1 % SOLN 15 mL 1 application as needed.    . Prenatal Vit-Fe Fumarate-FA (PRENATAL MULTIVITAMIN) TABS tablet Take 1 tablet by mouth daily at 12 noon.    . cetirizine (ZYRTEC) 10 MG tablet Take 10 mg by mouth daily.    . fluticasone (FLONASE) 50 MCG/ACT nasal spray USE 1-2 SPRAYS EACH NOSTRIL IN THE EVENING. (Patient not taking: Reported on 12/29/2014) 16 g 7   No current facility-administered medications for this visit.     ALLERGIES: Amoxapine and related  History reviewed. No pertinent family history.  SH:  Non smoker   ROS  PHYSICAL EXAMINATION:    BP 118/78 mmHg   Pulse 72  Resp 16  Ht 5\' 8"  (1.727 m)  Wt 155 lb (70.308 kg)  BMI 23.57 kg/m2  LMP 11/25/2014    General appearance: alert, cooperative and appears stated age No other exam performed  Assessment: Amenorrhea, first pregnancy  Plan: Return for viability scan.  Continue PNV.   15 minutes face to face time of which over 50% was spent in counseling.

## 2015-01-01 ENCOUNTER — Telehealth: Payer: Self-pay | Admitting: Obstetrics & Gynecology

## 2015-01-01 NOTE — Telephone Encounter (Signed)
Called patient to review benefits for procedure. Left voicemail to call back and review. °

## 2015-01-04 ENCOUNTER — Inpatient Hospital Stay (HOSPITAL_COMMUNITY)
Admission: AD | Admit: 2015-01-04 | Discharge: 2015-01-04 | Disposition: A | Discharge status: Home / Self Care | Payer: BC Managed Care – PPO | Source: Ambulatory Visit | Attending: Gynecology | Admitting: Gynecology

## 2015-01-04 ENCOUNTER — Ambulatory Visit (INDEPENDENT_AMBULATORY_CARE_PROVIDER_SITE_OTHER): Payer: BC Managed Care – PPO

## 2015-01-04 ENCOUNTER — Other Ambulatory Visit: Payer: Self-pay | Admitting: Obstetrics & Gynecology

## 2015-01-04 ENCOUNTER — Ambulatory Visit (INDEPENDENT_AMBULATORY_CARE_PROVIDER_SITE_OTHER): Payer: BC Managed Care – PPO | Admitting: Obstetrics & Gynecology

## 2015-01-04 ENCOUNTER — Other Ambulatory Visit (HOSPITAL_COMMUNITY)
Admission: RE | Admit: 2015-01-04 | Discharge: 2015-01-04 | Disposition: A | Discharge status: Home / Self Care | Payer: BC Managed Care – PPO | Source: Ambulatory Visit | Attending: Obstetrics & Gynecology | Admitting: Obstetrics & Gynecology

## 2015-01-04 VITALS — BP 132/82 | HR 60 | Resp 18 | Ht 68.0 in | Wt 155.0 lb

## 2015-01-04 DIAGNOSIS — Z3490 Encounter for supervision of normal pregnancy, unspecified, unspecified trimester: Secondary | ICD-10-CM | POA: Insufficient documentation

## 2015-01-04 DIAGNOSIS — O200 Threatened abortion: Secondary | ICD-10-CM | POA: Diagnosis not present

## 2015-01-04 DIAGNOSIS — N926 Irregular menstruation, unspecified: Secondary | ICD-10-CM | POA: Diagnosis not present

## 2015-01-04 LAB — ABO/RH: ABO/RH(D): O POS

## 2015-01-04 LAB — HCG, QUANTITATIVE, PREGNANCY: HCG, BETA CHAIN, QUANT, S: 217 m[IU]/mL — AB (ref <5)

## 2015-01-04 NOTE — Progress Notes (Signed)
29 y.o. G1P0 Married Caucasian female here for pelvic ultrasound due to amenorrhea.  LMP 11/25/14.  Pt reports a tiny amount of spotting this week.  Was old and brownish looking.  Denies pain.  Husband is with pt this week.  They were not "actively trying" for pregnancy but not actively preventing either.  She is on a PNV.   Patient's last menstrual period was 11/25/2014.   Findings:  UTERUS: anteverted uterus with thickened endometrium without evidence of gestational sac.   ADNEXA: Left ovary: also with collapsed corpus luteal cyst measuring 1.4 x 1.2cm with another cystic area inferior and medical to this measuring 12 x 9mm, echofree, avascular.  Possible paratubal cyst vs early ectopic pregnancy       Right ovary: collapsed corpus luteal cyst measuring 1.0cm with mild free free that appears clear CUL DE SAC: no free fluid  Findings reviewed with pt.  Pt states she almost sure she knows date of conception which would be November 25th or the week after this.  This makes her very early in comparison to her LMP.  D/W pt possibility of pregnancy being early vs early ectopic.  Treatment for ectopic reviewed, if need to proceed in that manner--laparoscopy vs methotrexate.  First, need to obtained blood type and HCG and will need to follow closely.  [facility] and then again on Sunday.  Appropriate levels discussed with typical rise over 48 hour period.  Pt and I reviewed signs and symptoms of ectopic and she is aware of reasons to go to hospital over the weekend.  Right now, I feel she is just early but will still plan to monitor very closely.  Pt and spouse in agreement with plan.  Assessment:  Early pregnancy vs early ectopic pregnancy vs threatened abortion Collapsed corpus luteal cysts Possible paratubal cyst  Plan:  Quantitative HCG today with blood type.  Repeat 48 hours.  If going up appropriately, will plan repeat PUS in 1-2 weeks, depending on pt's preferences.  All  questions answered.  ~30 minutes spent with patient >50% of time was in face to face discussion of above.

## 2015-01-07 ENCOUNTER — Telehealth: Payer: Self-pay | Admitting: Obstetrics and Gynecology

## 2015-01-07 ENCOUNTER — Inpatient Hospital Stay (HOSPITAL_COMMUNITY)
Admission: AD | Admit: 2015-01-07 | Discharge: 2015-01-07 | Disposition: A | Discharge status: Home / Self Care | Payer: BC Managed Care – PPO | Source: Ambulatory Visit | Attending: Obstetrics and Gynecology | Admitting: Obstetrics and Gynecology

## 2015-01-07 ENCOUNTER — Encounter (HOSPITAL_COMMUNITY): Payer: Self-pay | Admitting: Advanced Practice Midwife

## 2015-01-07 DIAGNOSIS — O26851 Spotting complicating pregnancy, first trimester: Secondary | ICD-10-CM

## 2015-01-07 DIAGNOSIS — O200 Threatened abortion: Secondary | ICD-10-CM | POA: Diagnosis not present

## 2015-01-07 DIAGNOSIS — O4690 Antepartum hemorrhage, unspecified, unspecified trimester: Secondary | ICD-10-CM

## 2015-01-07 DIAGNOSIS — Z3A01 Less than 8 weeks gestation of pregnancy: Secondary | ICD-10-CM | POA: Insufficient documentation

## 2015-01-07 LAB — COMPREHENSIVE METABOLIC PANEL
ALT: 17 U/L (ref 14–54)
AST: 21 U/L (ref 15–41)
Albumin: 4.8 g/dL (ref 3.5–5.0)
Alkaline Phosphatase: 45 U/L (ref 38–126)
Anion gap: 8 (ref 5–15)
BILIRUBIN TOTAL: 1.1 mg/dL (ref 0.3–1.2)
BUN: 12 mg/dL (ref 6–20)
CHLORIDE: 104 mmol/L (ref 101–111)
CO2: 27 mmol/L (ref 22–32)
CREATININE: 0.66 mg/dL (ref 0.44–1.00)
Calcium: 9.9 mg/dL (ref 8.9–10.3)
GFR calc Af Amer: >60 mL/min (ref >60)
Glucose, Bld: 99 mg/dL (ref 65–99)
Potassium: 4.2 mmol/L (ref 3.5–5.1)
Sodium: 139 mmol/L (ref 135–145)
TOTAL PROTEIN: 7.7 g/dL (ref 6.5–8.1)

## 2015-01-07 LAB — CBC
HEMATOCRIT: 40.1 % (ref 36.0–46.0)
HEMOGLOBIN: 14.6 g/dL (ref 12.0–15.0)
MCH: 32.4 pg (ref 26.0–34.0)
MCHC: 36.4 g/dL — ABNORMAL HIGH (ref 30.0–36.0)
MCV: 88.9 fL (ref 78.0–100.0)
Platelets: 229 10*3/uL (ref 150–400)
RBC: 4.51 MIL/uL (ref 3.87–5.11)
RDW: 12.6 % (ref 11.5–15.5)
WBC: 4.4 10*3/uL (ref 4.0–10.5)

## 2015-01-07 LAB — HCG, QUANTITATIVE, PREGNANCY: hCG, Beta Chain, Quant, S: 577 m[IU]/mL — ABNORMAL HIGH (ref <5)

## 2015-01-07 NOTE — MAU Note (Signed)
Pt presents for labs only from Dr Rondel BatonMiller's office. Denies any pain, reports scant amount of brown vaginal discharge

## 2015-01-07 NOTE — Telephone Encounter (Signed)
Called patient to review her BhcG results. She is feeling fine, still having some brown spotting. I will place an order for another BhcG on Tuesday (she is a Runner, broadcasting/film/videoteacher and will make an appointment for the end of the day. Will forward the chart to Dr Hyacinth MeekerMiller

## 2015-01-07 NOTE — MAU Provider Note (Signed)
HPI: Patty Sermonslizabeth S Lakeman is a 29 y.o. year old G1P0 female at 6.[redacted] weeks gestation who presents for F/U HCG. No and pain. Scant brown bleeding. Was seen by Dr. Hyacinth MeekerMiller 12/29/14 for pregnancy confirmation. US 01/04/15 showed no IUP, cystic area adjacent to left ovary. Quant 217. Supposed to get OP lab, but per consult w/ Dr. Oscar LaJertson will do Stat lab due to US findings. Recommended pt stay for results, but she strongly prefers to leave and be called w/ results. Stable for D/C. Will call.   [place] Rodrigo Mcgranahan, CNM 01/07/2015 8:55 AM  Addendum: Sharene ButtersQuant 577. Patient notified. Instructed to call office For follow-up.

## 2015-01-07 NOTE — Discharge Instructions (Signed)

## 2015-01-08 ENCOUNTER — Encounter: Payer: Self-pay | Admitting: Obstetrics & Gynecology

## 2015-01-08 ENCOUNTER — Telehealth: Payer: Self-pay | Admitting: Emergency Medicine

## 2015-01-08 ENCOUNTER — Other Ambulatory Visit: Payer: Self-pay | Admitting: Obstetrics & Gynecology

## 2015-01-08 DIAGNOSIS — O200 Threatened abortion: Secondary | ICD-10-CM

## 2015-01-08 NOTE — Telephone Encounter (Signed)
-----   Message from Jerene BearsMary S Miller, MD sent at 01/08/2015  7:16 AM EST ----- Regarding: needs HCG and PUS Please scheduled HCG for pt for tomorrow and then she needs PUS on Thursday.  Last ultrasound appt of the day is best for her.  Ordres have been placed.  Thanks.  Rosalita ChessmanSuzanne

## 2015-01-08 NOTE — Telephone Encounter (Signed)
Patient returned call. She is scheduled for lab appointment 01/09/15 at 1300 and Pelvic ultrasound 01/11/15 with Dr. Hyacinth MeekerMiller. Aware of benefits.  Routing to provider for final review. Patient agreeable to disposition. Will close encounter.

## 2015-01-08 NOTE — Telephone Encounter (Signed)
Called patient to discuss benefits for a procedure. Left Voicemail requesting a call. °

## 2015-01-09 ENCOUNTER — Other Ambulatory Visit (INDEPENDENT_AMBULATORY_CARE_PROVIDER_SITE_OTHER): Payer: BC Managed Care – PPO

## 2015-01-09 DIAGNOSIS — O200 Threatened abortion: Secondary | ICD-10-CM

## 2015-01-10 LAB — HCG, QUANTITATIVE, PREGNANCY: hCG, Beta Chain, Quant, S: 1099.9 m[IU]/mL — ABNORMAL HIGH

## 2015-01-11 ENCOUNTER — Ambulatory Visit (INDEPENDENT_AMBULATORY_CARE_PROVIDER_SITE_OTHER): Payer: BC Managed Care – PPO | Admitting: Obstetrics & Gynecology

## 2015-01-11 ENCOUNTER — Ambulatory Visit (INDEPENDENT_AMBULATORY_CARE_PROVIDER_SITE_OTHER): Payer: BC Managed Care – PPO

## 2015-01-11 ENCOUNTER — Other Ambulatory Visit: Payer: Self-pay | Admitting: Obstetrics & Gynecology

## 2015-01-11 ENCOUNTER — Other Ambulatory Visit: Payer: BC Managed Care – PPO

## 2015-01-11 ENCOUNTER — Other Ambulatory Visit: Payer: BC Managed Care – PPO | Admitting: Obstetrics & Gynecology

## 2015-01-11 VITALS — BP 120/60 | HR 70 | Resp 16 | Ht 68.0 in | Wt 155.0 lb

## 2015-01-11 DIAGNOSIS — O200 Threatened abortion: Secondary | ICD-10-CM | POA: Diagnosis not present

## 2015-01-11 DIAGNOSIS — O039 Complete or unspecified spontaneous abortion without complication: Secondary | ICD-10-CM | POA: Diagnosis not present

## 2015-01-11 LAB — HCG, QUANTITATIVE, PREGNANCY: hCG, Beta Chain, Quant, S: 498.5 m[IU]/mL — ABNORMAL HIGH

## 2015-01-11 NOTE — Progress Notes (Signed)
29 y.o. G1P0 Married Caucasian female here for pelvic ultrasound due to probable SAB.  Pt started bleeding yesterday and reports this is like a menstrual cycle.  HCGs were followed and increased over the weekend appropriately.  Having some cramping but this is manageable.    Pt [facility]. Already filled out survey and are hoping they will get called.  .  Patient's last menstrual period was 11/25/2014.  Findings:  UTERUS: anteverted without evidence of IUP EMS: 6.318mm ADNEXA: Left ovary:  2.2 x 1.6cm.  Continued left paratubal cyst measuring 5mm, echofree and avascular       Right ovary: 2.0 x 1.3cm CUL DE SAC: no free fluid  Findings reviewed with pt and spouse.  Will repeat HCG today and advise of results.  Will need to follow to normal value.  Pt voices understanding.  They are appropriately sad but hopeful for the future.  Recurrent miscarriage definition and testing reviewed with pt.  For now, they will just start trying after normal cycle and she will do OPT with next cycle as this ovulation was late.  May need to consider clomid or femara if that continues.  All qeustions answered.  Assessment:  Probable SAB Plan:  HCG today.  Expect it to be lower.  Will follow to normal value, <5.  ~15 minutes spent with patient >50% of time was in face to face discussion of above.

## 2015-01-12 ENCOUNTER — Encounter: Payer: Self-pay | Admitting: Obstetrics & Gynecology

## 2015-01-16 ENCOUNTER — Telehealth: Payer: Self-pay

## 2015-01-16 NOTE — Telephone Encounter (Signed)
Spoke with patient. Lab appointment scheduled for 01/18/2015 at 9:10 am. Patient is agreeable to date and time.  Routing to provider for final review. Patient agreeable to disposition. Will close encounter.

## 2015-01-16 NOTE — Telephone Encounter (Signed)
-----   Message from Jerene BearsMary S Miller, MD sent at 01/12/2015  2:36 PM EST ----- Pt is aware.  She needs to have this done in 7 days and the HCG needs to be follow down to normal value.  She is aware.  Stated she would call to schedule.  Does not look like she did.  Please call.  Thanks.  Order placed for lab.

## 2015-01-18 ENCOUNTER — Other Ambulatory Visit (INDEPENDENT_AMBULATORY_CARE_PROVIDER_SITE_OTHER): Payer: BC Managed Care – PPO

## 2015-01-18 DIAGNOSIS — O039 Complete or unspecified spontaneous abortion without complication: Secondary | ICD-10-CM

## 2015-01-18 LAB — HCG, QUANTITATIVE, PREGNANCY: hCG, Beta Chain, Quant, S: 15.3 m[IU]/mL — ABNORMAL HIGH

## 2015-01-26 ENCOUNTER — Other Ambulatory Visit (INDEPENDENT_AMBULATORY_CARE_PROVIDER_SITE_OTHER): Payer: BC Managed Care – PPO

## 2015-01-26 DIAGNOSIS — O039 Complete or unspecified spontaneous abortion without complication: Secondary | ICD-10-CM

## 2015-01-27 LAB — HCG, QUANTITATIVE, PREGNANCY

## 2015-01-29 ENCOUNTER — Telehealth: Payer: Self-pay | Admitting: Obstetrics & Gynecology

## 2015-01-29 NOTE — Telephone Encounter (Signed)
Patient calling to see if recent [facility] are [facility].

## 2015-01-29 NOTE — Telephone Encounter (Signed)
Spoke with patient. Advised of results as seen below from Dr.Miller. Patient is agreeable and verbalizes understanding.  Notes Recorded by Jerene BearsMary S Miller, MD on 01/29/2015 at 8:38 AM Inform pt lab work is now in normal range. They can start trying again at any time for pregnancy.  Routing to provider for final review. Patient agreeable to disposition. Will close encounter.

## 2015-02-22 ENCOUNTER — Telehealth: Payer: Self-pay | Admitting: Obstetrics & Gynecology

## 2015-02-22 NOTE — Telephone Encounter (Signed)
Patient had a miscarriage in December and she said Dr. Hyacinth Meeker said it was due to late ovulation. Patient says she hasn't ovulated and that Dr. Hyacinth Meeker discussed maybe starting her on a medication that would help with that. Patient is wanting to know her next step. Best # to reach: [phone]

## 2015-02-22 NOTE — Telephone Encounter (Signed)
It is ok to start on Femara after next cycle.  Does she want to come in and review this before she starts?  Ok to give a 4pm appt to her.

## 2015-02-22 NOTE — Telephone Encounter (Signed)
Spoke with patient. Patient states that she has been tracking her ovulation. LMP was 02/07/2015. States she had not ovulated thus far in this cycle. "I had a miscarriage in December and it was because the egg was fertilized too late in my cycle. I want to prevent this from happening again." Patient is asking what she can do to prevent this from occuring. Is also asking if she ovulates past the time she is supposed to if it will be okay if she does get pregnant. "Dr.Miller discussed starting on medication that may help." Advised I will speak with Dr.Miller and return call with further recommendations. She is agreeable.

## 2015-02-23 NOTE — Telephone Encounter (Signed)
Patient returning your call.

## 2015-02-23 NOTE — Telephone Encounter (Signed)
Spoke with patient. Advised of message as seen below from Dr.Miller. She is agreeable. Appointment scheduled for 02/26/2015 at 4 pm with Dr.Miller. She is agreeable to date and time.  Routing to provider for final review. Patient agreeable to disposition. Will close encounter.

## 2015-02-23 NOTE — Telephone Encounter (Signed)
Left message to call Natesha Hassey at [phone]. 

## 2015-02-26 ENCOUNTER — Ambulatory Visit (INDEPENDENT_AMBULATORY_CARE_PROVIDER_SITE_OTHER): Payer: BC Managed Care – PPO | Admitting: Obstetrics & Gynecology

## 2015-02-26 ENCOUNTER — Encounter: Payer: Self-pay | Admitting: Obstetrics & Gynecology

## 2015-02-26 VITALS — BP 120/76 | HR 66 | Resp 16 | Ht 68.0 in | Wt 160.0 lb

## 2015-02-26 DIAGNOSIS — F3281 Premenstrual dysphoric disorder: Secondary | ICD-10-CM

## 2015-02-26 MED ORDER — LETROZOLE 2.5 MG PO TABS: ORAL_TABLET | ORAL | Status: DC

## 2015-02-26 NOTE — Patient Instructions (Signed)
Start with next cycle.   Take 7.'5mg'$  on days 5-9 of your next cycle.   Do an ovulation predictor kit, if you want.   Should ovulated between days 15-16.   IF and only IF you want confirmation of ovulation, we can test Progesterone level on day 23.    If you don't get pregnant, cycle should start around day 30.

## 2015-03-02 NOTE — Progress Notes (Signed)
Subjective:     Patient ID: Abigail George, female   DOB: 05/26/1985, 30 y.o.   MRN: [id number]  HPI 30 yo G1P0 MWF here to discuss possible use of ovulation induction agents.  Pt had miscarriage in December.  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  Had had normal cycles since removal of IUD, just longer between cycles than expected.  She, her spouse, and I discussed possible use of Clomid or Femara after miscarriage.  She is interested in pursuing this.  She is on PNV.  Did have a normal cycle in January after miscarriage in December.   Reviewed with pt Femara vs clomid, dosing and administration of medications.  Side effects including but not limited to twins (5%) and triplets (1%) as well as ovarian hyperstimulation, hormonal changes, mood swings, cancer risk all discussed.  Pt voices clear understanding.  Questions answered.   Review of Systems  All other systems reviewed and are negative.      Objective:   Physical Exam  Constitutional: She is oriented to person, place, and time. She appears well-developed and well-nourished.  HENT:  Head: Atraumatic.  Neurological: She is alert and oriented to person, place, and time.  Psychiatric: She has a normal mood and affect.       Assessment:     Luteal phase defect Distance runner     Plan:     Will start Fermara 7.5mg  days 5-9 with next cycle.  Rx provided.  Pt to notify me when she starts this.  OTC ovulation predictor testing vs serum progesterone in the office discussed.  Pt does NOT was to go back to MAU after experience with miscarriage if at all possible.  She is aware this means she may have to miss a month with progesterone testing if this falls over a weekend.  She is ok with this.  If not pregnancy in 6 months, would refer to REI or sooner if pt desires. Do not feel SHGM or semen analysis necessary at this time due to pregnancy in December.  Consider after three months and/or  before REI referral.   ~15 minutes spent with patient >50% of time was in face to face discussion of above.

## 2015-03-05 ENCOUNTER — Telehealth: Payer: Self-pay | Admitting: Obstetrics & Gynecology

## 2015-03-05 DIAGNOSIS — F3281 Premenstrual dysphoric disorder: Secondary | ICD-10-CM | POA: Insufficient documentation

## 2015-03-05 NOTE — Telephone Encounter (Signed)
Patient calling regarding her medication dosage.

## 2015-03-06 NOTE — Telephone Encounter (Signed)
Left message to call Kaitlyn at [phone]. 

## 2015-03-06 NOTE — Telephone Encounter (Signed)
Spoke with patient. Patient states she is unsure of the Femara dose she is to be taking. Patient not yet started her cycle. Advised she will be taking Femara 2.5 mg but she will need to take 3 tablets per day to total 7.5 mg. She will take this with days 5-9 of her cycle. Advised she will need to contact the office with the first day of her next menstrual cycle so that she can receive instructions on what day to begin Femara and to schedule a day 23 Progesterone level. She is agreeable.  Routing to provider for final review. Patient agreeable to disposition. Will close encounter.

## 2015-03-09 ENCOUNTER — Telehealth: Payer: Self-pay | Admitting: Obstetrics & Gynecology

## 2015-03-09 NOTE — Telephone Encounter (Signed)
Spoke with patient. Patient started her cycle on 03/07/2015. Would like to know when she needs to start taking her Femara. Advised she will need to start taking Femara on 03/11/2015-03/15/2015.  Advised her Femara tablets will come in 2.5 mg and she will need to take 3 tablets per day to equal 7.5 mg. Aware she can take her Femara in the morning or at night. Advised she will need to choose a time that will work well for her at take it at the same time daily. She is agreeable. Day 23 progesterone level scheduled for 03/29/2015. Agreeable to date and time.   Routing to provider for final review. Patient agreeable to disposition. Will close encounter.

## 2015-03-09 NOTE — Telephone Encounter (Signed)
Patient called to give update she started cycle 03/07/15 and she has a question about when she should start the medication and if she should take it in the day time or night time. Best # to reach: [phone]

## 2015-03-29 ENCOUNTER — Other Ambulatory Visit (INDEPENDENT_AMBULATORY_CARE_PROVIDER_SITE_OTHER): Payer: BC Managed Care – PPO

## 2015-03-29 ENCOUNTER — Other Ambulatory Visit: Payer: Self-pay

## 2015-03-29 ENCOUNTER — Telehealth: Payer: Self-pay

## 2015-03-29 DIAGNOSIS — N979 Female infertility, unspecified: Secondary | ICD-10-CM

## 2015-03-29 LAB — PROGESTERONE: Progesterone: 14.5 ng/mL

## 2015-03-29 NOTE — Addendum Note (Signed)
Addended by: BELTRAN, Shana Zavaleta C on: 03/29/2015 04:44 PM   Modules accepted: Orders  

## 2015-03-29 NOTE — Addendum Note (Signed)
Addended by: Dion BodyBELTRAN, REINA C on: 03/29/2015 04:44 PM   Modules accepted: Orders

## 2015-03-29 NOTE — Telephone Encounter (Signed)
Order placed for patient to have Progesterone level in office today. Abigail George needed a new order as the encounter was previously closed.

## 2015-04-05 ENCOUNTER — Telehealth: Payer: Self-pay | Admitting: *Deleted

## 2015-04-05 DIAGNOSIS — F3281 Premenstrual dysphoric disorder: Secondary | ICD-10-CM

## 2015-04-05 NOTE — Telephone Encounter (Signed)
Patient requesting refill of femara to be sent to [place], she has started her cycle today and was told by Dr. Hyacinth MeekerMiller that she needs to start it. Best # to reach: [phone]

## 2015-04-05 NOTE — Telephone Encounter (Signed)
Left message to call Isys Tietje at [phone].   Notes Recorded by Jerene BearsMary S Miller, MD on 03/30/2015 at 10:36 AM Notified pt of result indicating ovulation.  Your progesterone level is good. I like to see it above 12. This shows ovulation. You should expect your cycle to start on day 30 if you are not pregnant this month. If you skip your cycle, do a home pregnancy test and let me know the results. If you start your cycle, please let me know so we can repeat the Femara.   Please let me know if you have any questions.   Rosalita ChessmanSuzanne

## 2015-04-06 MED ORDER — LETROZOLE 2.5 MG PO TABS: ORAL_TABLET | ORAL | Status: DC

## 2015-04-06 NOTE — Telephone Encounter (Signed)
Call to patient and she is given message from Dr. Hyacinth MeekerMiller and is agreeable to plan. [facility] and [facility] day progesterone lab scheduled for 04/27/15.  Will call back with any questions or concerns. Routing to provider for final review. Patient agreeable to disposition. Will close encounter.

## 2015-04-06 NOTE — Telephone Encounter (Signed)
Reviewed message.  Dr. Hyacinth MeekerMiller, patient started her cycle 04/05/15. Can you advise for Femara order and progesterone testing?

## 2015-04-06 NOTE — Telephone Encounter (Signed)
Ok to give Femara 7.5mg  days 5-9 of cycle.  I would repeat progesterone level again this month on day #23.  If elevated, again, doesn't need to repeat it.

## 2015-04-20 ENCOUNTER — Telehealth: Payer: Self-pay | Admitting: Obstetrics & Gynecology

## 2015-04-20 NOTE — Telephone Encounter (Signed)
There's nothing to worry about with this spotting.  However, if she is not pregnant this month, would recommend a sonohysterogram to make sure she does not have an endometrial polyp.  This will need to be done within the first 9 days of her cycle so we have a narrow window to check this.

## 2015-04-20 NOTE — Telephone Encounter (Signed)
Spoke with patient. LMP 04/05/15. Today is cycle day 16. Used Femara as directed for cycles days 5-9.  She states she noticed some brown spotting yesterday and today having a little bit of red spotting. Does not require a pad or tampon.  Intercourse last night.  Patient states she feels she may "hypersensitive" and worried about things more than she usually is but is calling to ensure that everything is okay. Reports some "cramping" feelings today. Not painful.  Advised patient some spotting/cramping with potential ovulation is normal.  Advised would review with Dr. Hyacinth MeekerMiller for any additional advise or instructions. Patient agreeable.

## 2015-04-20 NOTE — Telephone Encounter (Signed)
Patient would like to speak with nurse about some symptoms she's having that she thinks may be related to her medication.

## 2015-04-20 NOTE — Telephone Encounter (Signed)
Call to patient and she is given message from Dr. Hyacinth MeekerMiller. Verbalized understanding. She will call us if no cycle and positive pregnancy test or the first day of her cycle to plan Sonohysterogram.  Routing to provider for final review. Patient agreeable to disposition. Will close encounter.

## 2015-04-27 ENCOUNTER — Other Ambulatory Visit: Payer: BC Managed Care – PPO

## 2015-04-27 DIAGNOSIS — F3281 Premenstrual dysphoric disorder: Secondary | ICD-10-CM

## 2015-04-28 LAB — PROGESTERONE: Progesterone: 0.7 ng/mL

## 2015-05-01 ENCOUNTER — Telehealth: Payer: Self-pay

## 2015-05-01 ENCOUNTER — Encounter: Payer: Self-pay | Admitting: Obstetrics & Gynecology

## 2015-05-01 NOTE — Telephone Encounter (Signed)
Non-Urgent Medical Question  Message [phone]   From  Abigail George   To  Abigail BearsMary S Miller, Abigail George   Sent  05/01/2015 12:51 PM     Have the results from my blood work on Friday 4/7 come back yet? I was having my progesterone level checked to see if I ovulated.   Thank you for checking on this!   Abigail George      Responsible Party    Pool - [facility] Clinical Pool No one has taken responsibility for this message.     No actions have been taken on this message.     Routing to Dr.Miller for review and advise of results from 04/27/2015.

## 2015-05-01 NOTE — Telephone Encounter (Signed)
Telephone encounter created to discuss mychart message with Dr.Miller. 

## 2015-05-02 NOTE — Telephone Encounter (Signed)
Left detailed message for pt on voice mail after reviewing DPR.  FSH was low.  This looks like it was done on the wrong day.  Will confirm dates with pt that this was done.  Would recommend using Femara again next month and repeat day 23 progesterone.  If low, again, will refer to REI.  OK to close encounter.

## 2015-05-02 NOTE — Telephone Encounter (Signed)
No.  This is all good.  Thanks.

## 2015-05-02 NOTE — Telephone Encounter (Signed)
Spoke with patient. Patient states she is returning a call to Dr.Miller. Advised of message as seen below. She is agreeable and verbalizes understanding. States that her LMP started on 04/05/2015. She began to take Femara on 04/09/2015. Took Femara 2.5 mg 3 tablets per day until 04/13/2015. Progesterone was drawn on 04/27/2015 which was day 23 per dates provided by patient. Reports that during the time she was taking her Femara this month she was under a lot of stress and had spotting. Patient called in to discuss this on 04/20/2015. Per note Dr.Miller recommended a SHGM if she is not pregnant this month. Patient is on cycle day 28. Will take UPT if no menses by cycle day 30. If she begins her cycle she will contact the office on the first day to schedule her SHGM and for Femara rx for this month. Aware if Progesterone level is low again next month referral to REI is recommended. She is agreeable.  Dr.Miller, anything further needed for this patient?

## 2015-05-07 ENCOUNTER — Other Ambulatory Visit: Payer: Self-pay | Admitting: Obstetrics & Gynecology

## 2015-05-07 ENCOUNTER — Encounter: Payer: Self-pay | Admitting: Obstetrics & Gynecology

## 2015-05-07 ENCOUNTER — Other Ambulatory Visit (INDEPENDENT_AMBULATORY_CARE_PROVIDER_SITE_OTHER): Payer: BC Managed Care – PPO

## 2015-05-07 DIAGNOSIS — N912 Amenorrhea, unspecified: Secondary | ICD-10-CM

## 2015-05-08 LAB — HCG, SERUM, QUALITATIVE: Preg, Serum: NEGATIVE

## 2015-05-09 ENCOUNTER — Encounter: Payer: Self-pay | Admitting: Obstetrics & Gynecology

## 2015-05-09 MED ORDER — MEDROXYPROGESTERONE ACETATE 10 MG PO TABS: 10.0000 mg | ORAL_TABLET | Freq: Every day | ORAL | Status: DC

## 2015-05-18 ENCOUNTER — Telehealth: Payer: Self-pay | Admitting: Obstetrics & Gynecology

## 2015-05-18 NOTE — Telephone Encounter (Signed)
Left message to call Kaitlyn at [phone].  Patient needs to schedule SHGM during the first 9 days of her cycle. Please see telephone note from Dr.Miller from 04/20/2015.

## 2015-05-18 NOTE — Telephone Encounter (Signed)
Patient has been on progesterone for 9 days and started her cycle on yesterday.

## 2015-05-18 NOTE — Telephone Encounter (Signed)
Spoke with patient. Patient states that she has taken 9 days of Provera 10 mg. Started her cycle yesterday. [facility]. Advised this will need to done during the first 9 days of her cycle to check for tubal patency and endometrial polyp. She is agreeable. Patient is requesting an afternoon appointment on 5/2. Appointment scheduled for 05/22/2015 at 4 pm with 4:15 consult with Dr.Miller. She is agreeable to date and time.  Cc: Dr.Miller  Routing to covering provider for final review as Dr.Miller is out of the office this week. Patient agreeable to disposition. Will close encounter.

## 2015-05-21 ENCOUNTER — Telehealth: Payer: Self-pay

## 2015-05-21 DIAGNOSIS — N979 Female infertility, unspecified: Secondary | ICD-10-CM

## 2015-05-21 NOTE — Telephone Encounter (Signed)
-----   Message from Jari Favreebecca E Frahm sent at 05/21/2015  9:39 AM EDT ----- Regarding: orders Kaitlyn,  Please enter shgm orders for ms Headen for tomorrow at 4pm. i am calling for precert.  Thank you! Becky

## 2015-05-21 NOTE — Telephone Encounter (Signed)
[facility] and linked to patient's appointment on 05/22/2015.  Cc: Abigail Feathersebecca Frahm  Routing to provider for final review. Patient agreeable to disposition. Will close encounter.

## 2015-05-21 NOTE — Telephone Encounter (Signed)
I apologize. Order was placed under Dr.Miller and has been linked to her.  Cc: Dr.Silva  Encounter previously closed.

## 2015-05-21 NOTE — Telephone Encounter (Signed)
This is a Dr. [name] patient.  Please be sure the order is linked to her. Thanks!

## 2015-05-22 ENCOUNTER — Ambulatory Visit (INDEPENDENT_AMBULATORY_CARE_PROVIDER_SITE_OTHER): Payer: BC Managed Care – PPO

## 2015-05-22 ENCOUNTER — Encounter: Payer: Self-pay | Admitting: Obstetrics & Gynecology

## 2015-05-22 ENCOUNTER — Ambulatory Visit (INDEPENDENT_AMBULATORY_CARE_PROVIDER_SITE_OTHER): Payer: BC Managed Care – PPO | Admitting: Obstetrics & Gynecology

## 2015-05-22 ENCOUNTER — Other Ambulatory Visit: Payer: Self-pay | Admitting: Obstetrics & Gynecology

## 2015-05-22 VITALS — BP 112/76 | HR 60 | Ht 68.0 in | Wt 158.0 lb

## 2015-05-22 DIAGNOSIS — N926 Irregular menstruation, unspecified: Secondary | ICD-10-CM | POA: Diagnosis not present

## 2015-05-22 DIAGNOSIS — N979 Female infertility, unspecified: Secondary | ICD-10-CM | POA: Diagnosis not present

## 2015-05-22 DIAGNOSIS — F3281 Premenstrual dysphoric disorder: Secondary | ICD-10-CM | POA: Diagnosis not present

## 2015-05-22 DIAGNOSIS — Z01812 Encounter for preprocedural laboratory examination: Secondary | ICD-10-CM

## 2015-05-22 MED ORDER — DOXYCYCLINE HYCLATE 100 MG PO CAPS: 100.0000 mg | ORAL_CAPSULE | Freq: Two times a day (BID) | ORAL | Status: DC

## 2015-05-22 NOTE — Progress Notes (Signed)
30 y.o. Abigail George Marriedfemale here for a pelvic ultrasound with sonohystogram due to concerns regarding timing of ovulation as well as assessment of tubal patency.  Pt had a miscarriage in December.  HCGs were followed to normal.  Pt's cycles are long consistent with teal phase defect so started on femara.  She's taken the medication the same in March and April but progesterone levels were 14.5 and 0.7 respectively.  She states her cycle was much later in April as well.  Here for further evaluation.  She is on day 5 of her cycle  Contraception: none  Technique:  Both transabdominal and transvaginal ultrasound examinations of the pelvis were performed. Transabdominal technique was performed for global imaging of the pelvis including uterus, ovaries, adnexal regions, and pelvic cul-de-sac.  It was necessary to proceed with endovaginal exam following the abdominal ultrasound transabdominal exam to visualize the endometrium and adnexa.  Color and duplex Doppler ultrasound was utilized to evaluate blood flow to the ovaries.    FINDINGS: Uterus: 7.8 x 4.3 x 3.4cm Endometrium: 3.530mm Adnexa:  Left: 3.8 x 2.5 x 1.9cm with 1.0cm follicle     Right: 3.2 x 1.9 x 2.0cm Cul de sac: no free fluid  SHSG:  After obtaining appropriate verbal consent from patient, the cervix was visualized using a speculum, and prepped with betadine.  A tenaculum  was applied to the cervix.  Dilation of the cervix was not necessary. The catheter was passed into the uterus and sterile saline introduced, with the following findings:  Normal cavity with thin endometrial lining, no masses.  Tubal patency on right with spillage in the right adnexal region noted.  No spill on left side noted.  Assessment:  Luteal phase defect H/O sab 12/16 On fermara Tubal patency on right noted, only, today  Plan:   Will continue with Fermara for up to six months.  Referral to REI offered to pt at any time.   Doxycycline 100mg  bid x 3 days.   #6/0RF.  ~25 minutes spent with patient >50% of time was in face to face discussion of results from ultrasound as well as treatment options/management.

## 2015-06-14 ENCOUNTER — Encounter: Payer: Self-pay | Admitting: Obstetrics & Gynecology

## 2015-06-21 ENCOUNTER — Encounter: Payer: Self-pay | Admitting: Obstetrics & Gynecology

## 2015-06-21 ENCOUNTER — Other Ambulatory Visit: Payer: Self-pay | Admitting: Obstetrics & Gynecology

## 2015-06-21 DIAGNOSIS — N970 Female infertility associated with anovulation: Secondary | ICD-10-CM

## 2015-06-21 MED ORDER — LETROZOLE 2.5 MG PO TABS: ORAL_TABLET | ORAL | Status: DC

## 2015-07-13 ENCOUNTER — Other Ambulatory Visit (INDEPENDENT_AMBULATORY_CARE_PROVIDER_SITE_OTHER): Payer: BC Managed Care – PPO

## 2015-07-13 DIAGNOSIS — N970 Female infertility associated with anovulation: Secondary | ICD-10-CM

## 2015-07-13 LAB — PROGESTERONE: Progesterone: <0.5 ng/mL

## 2015-07-14 ENCOUNTER — Encounter: Payer: Self-pay | Admitting: Obstetrics & Gynecology

## 2015-07-26 ENCOUNTER — Encounter: Payer: Self-pay | Admitting: Obstetrics & Gynecology

## 2015-07-30 ENCOUNTER — Telehealth: Payer: Self-pay | Admitting: Obstetrics & Gynecology

## 2015-07-30 MED ORDER — CLOMIPHENE CITRATE 50 MG PO TABS: 50.0000 mg | ORAL_TABLET | Freq: Every day | ORAL | Status: DC

## 2015-07-30 MED ORDER — MEDROXYPROGESTERONE ACETATE 10 MG PO TABS: 10.0000 mg | ORAL_TABLET | Freq: Every day | ORAL | Status: DC

## 2015-07-30 NOTE — Telephone Encounter (Signed)
Patient is on cycle day 40 at this time and has not had her menses. She has taken two UPTa which were negative. Patient is requesting to take progesterone to start her menses. She is also requesting to start Clomid at this time. States she has been taking Femara without success and would like to try an alternative. She is scheduled to see Dr.Yalcinkaya on September 11th. Advised I will speak with Dr.Miller and return call with further recommendations. She is agreeable.

## 2015-07-30 NOTE — Telephone Encounter (Signed)
Spoke with patient. Advised of message as seen below from Dr.Miller. She is agreeable and verbalizes understanding. Rx for Provera 10 mg #10 0RF and Clomid 50 mg #5 0RF sent to pharmacy on file. Patient is asking if she may have a day 23 progesterone level checked. "Since I am being referred to a fertility specialist I want to have documentation that I also took Clomid and whether or not I ovulated with it." Advised patient she will need to contact the office with the first day of her menses to schedule day 23 progesterone level. She is agreeable.  Routing to provider for final review. Patient agreeable to disposition. Will close encounter.

## 2015-07-30 NOTE — Telephone Encounter (Signed)
Patient would like to start progesterone to start period.

## 2015-07-30 NOTE — Telephone Encounter (Signed)
Ok to give provera 10mg  x 10 days.  Then start clomid 50mg  days 5-9 of cycle.  OK to give RX for both.  Has appt with Dr. April MansonYalcinkaya already scheduled.

## 2015-08-08 ENCOUNTER — Telehealth: Payer: Self-pay

## 2015-08-08 NOTE — Telephone Encounter (Signed)
Spoke with patient. Advised she received notification that her Clomid rx was not going to to be covered by insurance. PA was completed for Clomid, but was denied. Patient is agreeable. Reports the Clomid will be $25 dollars and she will pay OOP for the rx. States she completed her 10 days of Provera this morning and has not started her menses. Advised it may take up to 2 weeks after taking her last dose for her body to generate a cycle. Advised to monitor until August 2nd and notify the office if she has not had any bleeding. If she does start her menses she will need to take Clomid days 5-9 of her cycle. She is agreeable.  Routing to provider for final review. Patient agreeable to disposition. Will close encounter.

## 2015-08-10 ENCOUNTER — Telehealth: Payer: Self-pay

## 2015-08-10 ENCOUNTER — Telehealth: Payer: Self-pay | Admitting: Obstetrics & Gynecology

## 2015-08-10 DIAGNOSIS — N970 Female infertility associated with anovulation: Secondary | ICD-10-CM

## 2015-08-10 NOTE — Telephone Encounter (Signed)
Encounter opened in error

## 2015-08-10 NOTE — Telephone Encounter (Signed)
Spoke with patient. Patient states she started having spotting on Wednesday 08/08/2015. Advised she will need to take Clomid from 7/23-7/27. Patient is agreeable. She is requesting to have a day 23 progesterone level checked. Appointment scheduled for 08/30/2015 at 3:40 pm. She is agreeable to date and time. Aware she will need to take pregnancy test if she has not had a menses by day 30. Will need to report positive or negative results. Will also need to contact the office if she starts her menses. She is agreeable.  Routing to provider for final review. Patient agreeable to disposition. Will close encounter.

## 2015-08-10 NOTE — Telephone Encounter (Signed)
Patient is calling with start of menses to start clomid.  Patient had spotting on Wednesday with heavier flow on Thursday.  Patient needs to know what day to start Clomid and to schedule lab appointment for progesterone check.

## 2015-08-30 ENCOUNTER — Other Ambulatory Visit (INDEPENDENT_AMBULATORY_CARE_PROVIDER_SITE_OTHER): Payer: BC Managed Care – PPO

## 2015-08-30 DIAGNOSIS — N970 Female infertility associated with anovulation: Secondary | ICD-10-CM

## 2015-08-31 LAB — PROGESTERONE: Progesterone: 12 ng/mL

## 2015-09-05 ENCOUNTER — Telehealth: Payer: Self-pay

## 2015-09-05 ENCOUNTER — Other Ambulatory Visit (INDEPENDENT_AMBULATORY_CARE_PROVIDER_SITE_OTHER): Payer: BC Managed Care – PPO

## 2015-09-05 ENCOUNTER — Telehealth: Payer: Self-pay | Admitting: Obstetrics & Gynecology

## 2015-09-05 DIAGNOSIS — N912 Amenorrhea, unspecified: Secondary | ICD-10-CM

## 2015-09-05 NOTE — Telephone Encounter (Signed)
Order placed for patient to have HCG quant today per Dr.Miller.  Routing to provider for final review. Patient agreeable to disposition. Will close encounter.

## 2015-09-05 NOTE — Telephone Encounter (Signed)
Spoke with patient while patient is in the office today for quantitative hcg level. Patient reports she took a UPT yesterday which was positive. Three hours after she had one episode of light dark brown spotting. Reports today she had two episodes of small pink dots on a panty liner. Denies any pelvic pain or discomfort. Patient is due for her menses to start on Saturday 09/08/2015. Advised patient this can be normal due to implantation bleeding, but it will be important that we monitor her hcg levels for follow up. Advised if she develops any further bleeding or pain she will need to be seen for immediate evaluation. Patient is agreeable. Aware she will be contacted with her hcg quant results and with further recommendations. She is agreeable.   Reviewed with Dr.Jertson who is agreeable with plan.  Routing to Dr.Miller.

## 2015-09-05 NOTE — Telephone Encounter (Signed)
Patient is here for the lab today but requested to speak with the nurse. She said, "I've taken positive pregnancy tests but now I have had some spotting."

## 2015-09-06 ENCOUNTER — Encounter: Payer: Self-pay | Admitting: Obstetrics & Gynecology

## 2015-09-06 LAB — HCG, QUANTITATIVE, PREGNANCY: hCG, Beta Chain, Quant, S: 113.7 m[IU]/mL — ABNORMAL HIGH

## 2015-09-06 NOTE — Telephone Encounter (Signed)
Entered by Jerene BearsMary S Miller, MD at 09/06/2015 8:54 AM  Lanora ManisElizabeth,  Your HCG was 113. This is good considering how early you are. You are scheduled for repeat on Monday. As long as it is going up like it should (and there's no reason to think it won't), then I'll schedule the ultrasound for you. We can do the 4pm time for your schedule.   Rosalita ChessmanSuzanne   Dr.Miller, based on hcg results okay to close encounter at this time?

## 2015-09-06 NOTE — Telephone Encounter (Signed)
Ok to close encounter. 

## 2015-09-10 ENCOUNTER — Other Ambulatory Visit: Payer: Self-pay

## 2015-09-10 ENCOUNTER — Other Ambulatory Visit (INDEPENDENT_AMBULATORY_CARE_PROVIDER_SITE_OTHER): Payer: BC Managed Care – PPO

## 2015-09-10 DIAGNOSIS — N912 Amenorrhea, unspecified: Secondary | ICD-10-CM

## 2015-09-11 ENCOUNTER — Encounter: Payer: Self-pay | Admitting: Obstetrics & Gynecology

## 2015-09-11 DIAGNOSIS — O3680X0 Pregnancy with inconclusive fetal viability, not applicable or unspecified: Secondary | ICD-10-CM

## 2015-09-11 LAB — HCG, QUANTITATIVE, PREGNANCY: HCG, BETA CHAIN, QUANT, S: 1455.4 m[IU]/mL — AB

## 2015-09-20 ENCOUNTER — Encounter: Payer: Self-pay | Admitting: Obstetrics & Gynecology

## 2015-09-20 ENCOUNTER — Ambulatory Visit (INDEPENDENT_AMBULATORY_CARE_PROVIDER_SITE_OTHER): Payer: BC Managed Care – PPO | Admitting: Obstetrics & Gynecology

## 2015-09-20 ENCOUNTER — Ambulatory Visit (INDEPENDENT_AMBULATORY_CARE_PROVIDER_SITE_OTHER): Payer: BC Managed Care – PPO

## 2015-09-20 VITALS — BP 104/68 | HR 76 | Resp 16 | Ht 68.0 in | Wt 157.0 lb

## 2015-09-20 DIAGNOSIS — O3680X0 Pregnancy with inconclusive fetal viability, not applicable or unspecified: Secondary | ICD-10-CM | POA: Diagnosis not present

## 2015-09-20 DIAGNOSIS — N912 Amenorrhea, unspecified: Secondary | ICD-10-CM | POA: Diagnosis not present

## 2015-09-20 NOTE — Progress Notes (Signed)
30 y.o. [id number] Married Caucasian female here for pelvic ultrasound due to +UPT.  LMP was 08/08/15 and EDC by LMP is 05/14/16 with EGA 6 1/7 weeks.  Pt having breast tenderness, queasiness but no emesis, and significant fatigue.  Denies vaginal bleeding or pain.  Patient's last menstrual period was 08/08/2015.  Findings:  UTERUS: Mason JimSingleton IUD with CRL 0.34cm c/w 6 0/[redacted] weeks gestation.  FCA 92 BPM..  Normal gestational sac and yolk sac seen. ADNEXA: Left ovary: 2.4 x 1.7cm       Right ovary: 3.8 x 3.5 with 2.4 x 2.4cm corpus luteal cyst CUL DE SAC: no free fluid  Discussion:  Findings reviewed with pt.  D/w pt "normal" FCA but early gestational age so there are no concerns with finding.    They do not have cats in the home.  Reviewed no changing kitty litter.  Tdap 02/03/2007.  Had chicken pox.  Aware flu vaccine safe and recommended.   Fish/shellfish/mercury discuss.  Patient will review current recommendations regarding this.  Unpasteurized cheese/juices discussed.  Nitrites in foods disucssed.  Exercise and intercourse discussed.  Pt is a distance runner.  Advised needs to stay good and hydrated but as long as she feels capable, she can continue to run.  Fetal DNA particle testing discussed.  First trimester down's testing discussed.  Cystic fibrosis discussed. They will check into insurance coverage for fetal particle DNA testing.  Very interested in this.  Assessment:  6 0/7 weeks singleton IUD by PUS (6 1/7 weeks by LMP dating). Prior SAB  Plan:  Pt will plan to transfer care and let me know where this will be for transfer of records. October AEX will be cancelled.  ~15 minutes spent with patient >50% of time was in face to face discussion of above.

## 2015-10-18 LAB — OB RESULTS CONSOLE ABO/RH: RH TYPE: POSITIVE

## 2015-10-18 LAB — OB RESULTS CONSOLE ANTIBODY SCREEN: ANTIBODY SCREEN: NEGATIVE

## 2015-10-18 LAB — OB RESULTS CONSOLE RUBELLA ANTIBODY, IGM: RUBELLA: IMMUNE

## 2015-10-18 LAB — OB RESULTS CONSOLE RPR: RPR: NONREACTIVE

## 2015-10-18 LAB — OB RESULTS CONSOLE HIV ANTIBODY (ROUTINE TESTING): HIV: NONREACTIVE

## 2015-10-18 LAB — OB RESULTS CONSOLE HEPATITIS B SURFACE ANTIGEN: HEP B S AG: NEGATIVE

## 2015-10-24 LAB — OB RESULTS CONSOLE GC/CHLAMYDIA
CHLAMYDIA, DNA PROBE: NEGATIVE
Gonorrhea: NEGATIVE

## 2015-10-30 ENCOUNTER — Ambulatory Visit: Payer: BC Managed Care – PPO | Admitting: Obstetrics & Gynecology

## 2016-04-10 LAB — OB RESULTS CONSOLE GBS: GBS: POSITIVE

## 2016-05-08 ENCOUNTER — Inpatient Hospital Stay (HOSPITAL_COMMUNITY)
Admission: AD | Admit: 2016-05-08 | Discharge: 2016-05-11 | DRG: 765 | Disposition: A | Discharge status: Home / Self Care | Payer: BC Managed Care – PPO | Source: Ambulatory Visit | Attending: Obstetrics and Gynecology | Admitting: Obstetrics and Gynecology

## 2016-05-08 ENCOUNTER — Inpatient Hospital Stay (HOSPITAL_COMMUNITY): Payer: BC Managed Care – PPO | Admitting: Anesthesiology

## 2016-05-08 ENCOUNTER — Encounter (HOSPITAL_COMMUNITY)
Admission: AD | Disposition: A | Discharge status: Home / Self Care | Payer: Self-pay | Source: Ambulatory Visit | Attending: Obstetrics and Gynecology

## 2016-05-08 ENCOUNTER — Encounter (HOSPITAL_COMMUNITY): Payer: Self-pay | Admitting: *Deleted

## 2016-05-08 DIAGNOSIS — O3663X0 Maternal care for excessive fetal growth, third trimester, not applicable or unspecified: Secondary | ICD-10-CM | POA: Diagnosis present

## 2016-05-08 DIAGNOSIS — O99824 Streptococcus B carrier state complicating childbirth: Secondary | ICD-10-CM | POA: Diagnosis present

## 2016-05-08 DIAGNOSIS — O4292 Full-term premature rupture of membranes, unspecified as to length of time between rupture and onset of labor: Secondary | ICD-10-CM | POA: Diagnosis present

## 2016-05-08 DIAGNOSIS — Z3A39 39 weeks gestation of pregnancy: Secondary | ICD-10-CM | POA: Diagnosis not present

## 2016-05-08 LAB — TYPE AND SCREEN
ABO/RH(D): O POS
ANTIBODY SCREEN: NEGATIVE

## 2016-05-08 LAB — CBC
HCT: 37.1 % (ref 36.0–46.0)
Hemoglobin: 13.4 g/dL (ref 12.0–15.0)
MCH: 30.5 pg (ref 26.0–34.0)
MCHC: 36.1 g/dL — AB (ref 30.0–36.0)
MCV: 84.5 fL (ref 78.0–100.0)
PLATELETS: 228 10*3/uL (ref 150–400)
RBC: 4.39 MIL/uL (ref 3.87–5.11)
RDW: 12.6 % (ref 11.5–15.5)
WBC: 11.6 10*3/uL — AB (ref 4.0–10.5)

## 2016-05-08 SURGERY — Surgical Case
Anesthesia: Regional

## 2016-05-08 MED ORDER — OXYTOCIN 10 UNIT/ML IJ SOLN
INTRAVENOUS | Status: DC | PRN
  Administered 2016-05-08: 40 [IU] via INTRAVENOUS

## 2016-05-08 MED ORDER — KETOROLAC TROMETHAMINE 30 MG/ML IJ SOLN
INTRAMUSCULAR | Status: AC
  Filled 2016-05-08: qty 1

## 2016-05-08 MED ORDER — OXYTOCIN 40 UNITS IN LACTATED RINGERS INFUSION - SIMPLE MED: 2.5000 [IU]/h | INTRAVENOUS | Status: DC

## 2016-05-08 MED ORDER — SOD CITRATE-CITRIC ACID 500-334 MG/5ML PO SOLN
30.0000 mL | ORAL | Status: DC | PRN
  Filled 2016-05-08: qty 15

## 2016-05-08 MED ORDER — KETOROLAC TROMETHAMINE 30 MG/ML IJ SOLN
30.0000 mg | Freq: Four times a day (QID) | INTRAMUSCULAR | Status: AC | PRN
  Administered 2016-05-08: 30 mg via INTRAMUSCULAR

## 2016-05-08 MED ORDER — MORPHINE SULFATE (PF) 0.5 MG/ML IJ SOLN
INTRAMUSCULAR | Status: DC | PRN
  Administered 2016-05-08 (×2): .5 mg via EPIDURAL
  Administered 2016-05-08: 4 mg via EPIDURAL

## 2016-05-08 MED ORDER — CEFAZOLIN SODIUM-DEXTROSE 2-4 GM/100ML-% IV SOLN
INTRAVENOUS | Status: AC
  Filled 2016-05-08: qty 100

## 2016-05-08 MED ORDER — ONDANSETRON HCL 4 MG/2ML IJ SOLN
4.0000 mg | Freq: Four times a day (QID) | INTRAMUSCULAR | Status: DC | PRN
  Administered 2016-05-08: 4 mg via INTRAVENOUS
  Filled 2016-05-08: qty 2

## 2016-05-08 MED ORDER — LACTATED RINGERS IV SOLN
INTRAVENOUS | Status: DC
  Administered 2016-05-08 (×2): via INTRAVENOUS

## 2016-05-08 MED ORDER — LACTATED RINGERS IV SOLN: 500.0000 mL | INTRAVENOUS | Status: DC | PRN

## 2016-05-08 MED ORDER — CEFAZOLIN SODIUM-DEXTROSE 2-4 GM/100ML-% IV SOLN
2.0000 g | Freq: Once | INTRAVENOUS | Status: AC
  Administered 2016-05-08: 2 g via INTRAVENOUS
  Filled 2016-05-08: qty 100

## 2016-05-08 MED ORDER — ONDANSETRON HCL 4 MG/2ML IJ SOLN
INTRAMUSCULAR | Status: DC | PRN
  Administered 2016-05-08: 4 mg via INTRAVENOUS

## 2016-05-08 MED ORDER — DEXAMETHASONE SODIUM PHOSPHATE 4 MG/ML IJ SOLN
INTRAMUSCULAR | Status: DC | PRN
  Administered 2016-05-08: 4 mg via INTRAVENOUS

## 2016-05-08 MED ORDER — SCOPOLAMINE 1 MG/3DAYS TD PT72
MEDICATED_PATCH | TRANSDERMAL | Status: AC
  Filled 2016-05-08: qty 1

## 2016-05-08 MED ORDER — LACTATED RINGERS IV SOLN: 500.0000 mL | Freq: Once | INTRAVENOUS | Status: DC

## 2016-05-08 MED ORDER — KETOROLAC TROMETHAMINE 30 MG/ML IJ SOLN: 30.0000 mg | Freq: Four times a day (QID) | INTRAMUSCULAR | Status: AC | PRN

## 2016-05-08 MED ORDER — SCOPOLAMINE 1 MG/3DAYS TD PT72
MEDICATED_PATCH | TRANSDERMAL | Status: DC | PRN
  Administered 2016-05-08: 1 via TRANSDERMAL

## 2016-05-08 MED ORDER — MEPERIDINE HCL 25 MG/ML IJ SOLN
INTRAMUSCULAR | Status: AC
  Filled 2016-05-08: qty 1

## 2016-05-08 MED ORDER — DEXAMETHASONE SODIUM PHOSPHATE 4 MG/ML IJ SOLN
INTRAMUSCULAR | Status: AC
  Filled 2016-05-08: qty 1

## 2016-05-08 MED ORDER — PHENYLEPHRINE 40 MCG/ML (10ML) SYRINGE FOR IV PUSH (FOR BLOOD PRESSURE SUPPORT)
80.0000 ug | PREFILLED_SYRINGE | INTRAVENOUS | Status: AC | PRN
  Administered 2016-05-08: 40 ug via INTRAVENOUS
  Administered 2016-05-08: 120 ug via INTRAVENOUS
  Administered 2016-05-08: 80 ug via INTRAVENOUS
  Administered 2016-05-08: 40 ug via INTRAVENOUS

## 2016-05-08 MED ORDER — LIDOCAINE HCL (PF) 1 % IJ SOLN: 30.0000 mL | INTRAMUSCULAR | Status: DC | PRN

## 2016-05-08 MED ORDER — CEFAZOLIN SODIUM-DEXTROSE 2-3 GM-% IV SOLR
INTRAVENOUS | Status: DC | PRN
  Administered 2016-05-08: 2 g via INTRAVENOUS

## 2016-05-08 MED ORDER — OXYCODONE-ACETAMINOPHEN 5-325 MG PO TABS: 1.0000 | ORAL_TABLET | ORAL | Status: DC | PRN

## 2016-05-08 MED ORDER — BUPIVACAINE HCL (PF) 0.25 % IJ SOLN
INTRAMUSCULAR | Status: DC | PRN
  Administered 2016-05-08: 20 mL

## 2016-05-08 MED ORDER — OXYTOCIN BOLUS FROM INFUSION: 500.0000 mL | Freq: Once | INTRAVENOUS | Status: DC

## 2016-05-08 MED ORDER — ONDANSETRON HCL 4 MG/2ML IJ SOLN
INTRAMUSCULAR | Status: AC
  Filled 2016-05-08: qty 2

## 2016-05-08 MED ORDER — DIPHENHYDRAMINE HCL 50 MG/ML IJ SOLN: 12.5000 mg | INTRAMUSCULAR | Status: DC | PRN

## 2016-05-08 MED ORDER — LACTATED RINGERS IV SOLN
INTRAVENOUS | Status: DC | PRN
  Administered 2016-05-08: 22:00:00 via INTRAVENOUS

## 2016-05-08 MED ORDER — LIDOCAINE HCL (PF) 1 % IJ SOLN
INTRAMUSCULAR | Status: DC | PRN
  Administered 2016-05-08: 7 mL via EPIDURAL
  Administered 2016-05-08: 6 mL via EPIDURAL

## 2016-05-08 MED ORDER — PHENYLEPHRINE 40 MCG/ML (10ML) SYRINGE FOR IV PUSH (FOR BLOOD PRESSURE SUPPORT): 80.0000 ug | PREFILLED_SYRINGE | INTRAVENOUS | Status: DC | PRN

## 2016-05-08 MED ORDER — BUPIVACAINE HCL (PF) 0.25 % IJ SOLN
INTRAMUSCULAR | Status: AC
  Filled 2016-05-08: qty 20

## 2016-05-08 MED ORDER — OXYCODONE-ACETAMINOPHEN 5-325 MG PO TABS: 2.0000 | ORAL_TABLET | ORAL | Status: DC | PRN

## 2016-05-08 MED ORDER — SODIUM BICARBONATE 8.4 % IV SOLN
INTRAVENOUS | Status: DC | PRN
  Administered 2016-05-08: 80 mL via EPIDURAL

## 2016-05-08 MED ORDER — FENTANYL CITRATE (PF) 100 MCG/2ML IJ SOLN: 50.0000 ug | INTRAMUSCULAR | Status: DC | PRN

## 2016-05-08 MED ORDER — FENTANYL 2.5 MCG/ML BUPIVACAINE 1/10 % EPIDURAL INFUSION (WH - ANES)
14.0000 mL/h | INTRAMUSCULAR | Status: DC | PRN
  Administered 2016-05-08: 14 mL/h via EPIDURAL

## 2016-05-08 MED ORDER — ACETAMINOPHEN 325 MG PO TABS: 650.0000 mg | ORAL_TABLET | ORAL | Status: DC | PRN

## 2016-05-08 MED ORDER — FLEET ENEMA 7-19 GM/118ML RE ENEM: 1.0000 | ENEMA | RECTAL | Status: DC | PRN

## 2016-05-08 MED ORDER — PHENYLEPHRINE 40 MCG/ML (10ML) SYRINGE FOR IV PUSH (FOR BLOOD PRESSURE SUPPORT)
PREFILLED_SYRINGE | INTRAVENOUS | Status: AC
  Filled 2016-05-08: qty 10

## 2016-05-08 MED ORDER — MORPHINE SULFATE (PF) 0.5 MG/ML IJ SOLN
INTRAMUSCULAR | Status: AC
  Filled 2016-05-08: qty 10

## 2016-05-08 MED ORDER — PHENYLEPHRINE 40 MCG/ML (10ML) SYRINGE FOR IV PUSH (FOR BLOOD PRESSURE SUPPORT)
PREFILLED_SYRINGE | INTRAVENOUS | Status: AC
  Filled 2016-05-08: qty 20

## 2016-05-08 MED ORDER — OXYTOCIN 10 UNIT/ML IJ SOLN
INTRAMUSCULAR | Status: AC
  Filled 2016-05-08: qty 4

## 2016-05-08 MED ORDER — CEFAZOLIN IN D5W 1 GM/50ML IV SOLN
1.0000 g | Freq: Three times a day (TID) | INTRAVENOUS | Status: DC
  Filled 2016-05-08: qty 50

## 2016-05-08 MED ORDER — FENTANYL 2.5 MCG/ML BUPIVACAINE 1/10 % EPIDURAL INFUSION (WH - ANES)
INTRAMUSCULAR | Status: AC
  Filled 2016-05-08: qty 100

## 2016-05-08 MED ORDER — EPHEDRINE 5 MG/ML INJ: 10.0000 mg | INTRAVENOUS | Status: DC | PRN

## 2016-05-08 MED ORDER — MEPERIDINE HCL 25 MG/ML IJ SOLN
INTRAMUSCULAR | Status: DC | PRN
  Administered 2016-05-08 (×2): 12.5 mg via INTRAVENOUS

## 2016-05-08 SURGICAL SUPPLY — 38 items
ADH SKN CLS APL DERMABOND .7 (GAUZE/BANDAGES/DRESSINGS) ×1
CHLORAPREP W/TINT 26ML (MISCELLANEOUS) ×3 IMPLANT
CLAMP CORD UMBIL (MISCELLANEOUS) IMPLANT
CLOSURE STERI STRIP 1/2 X4 (GAUZE/BANDAGES/DRESSINGS) ×3 IMPLANT
CLOTH BEACON ORANGE TIMEOUT ST (SAFETY) ×3 IMPLANT
CONTAINER PREFILL 10% NBF 15ML (MISCELLANEOUS) IMPLANT
DERMABOND ADVANCED (GAUZE/BANDAGES/DRESSINGS) ×2
DERMABOND ADVANCED .7 DNX12 (GAUZE/BANDAGES/DRESSINGS) ×1 IMPLANT
DRSG OPSITE POSTOP 4X10 (GAUZE/BANDAGES/DRESSINGS) ×3 IMPLANT
ELECT REM PT RETURN 9FT ADLT (ELECTROSURGICAL) ×3
ELECTRODE REM PT RTRN 9FT ADLT (ELECTROSURGICAL) ×1 IMPLANT
EXTRACTOR VACUUM M CUP 4 TUBE (SUCTIONS) IMPLANT
EXTRACTOR VACUUM M CUP 4' TUBE (SUCTIONS)
GLOVE BIO SURGEON STRL SZ7.5 (GLOVE) ×3 IMPLANT
GLOVE BIOGEL PI IND STRL 7.0 (GLOVE) ×1 IMPLANT
GLOVE BIOGEL PI INDICATOR 7.0 (GLOVE) ×2
GOWN STRL REUS W/TWL LRG LVL3 (GOWN DISPOSABLE) ×6 IMPLANT
KIT ABG SYR 3ML LUER SLIP (SYRINGE) IMPLANT
NDL SPNL 20GX3.5 QUINCKE YW (NEEDLE) IMPLANT
NEEDLE HYPO 22GX1.5 SAFETY (NEEDLE) ×3 IMPLANT
NEEDLE HYPO 25X5/8 SAFETYGLIDE (NEEDLE) IMPLANT
NEEDLE SPNL 20GX3.5 QUINCKE YW (NEEDLE) IMPLANT
NS IRRIG 1000ML POUR BTL (IV SOLUTION) ×3 IMPLANT
PACK C SECTION WH (CUSTOM PROCEDURE TRAY) ×3 IMPLANT
PENCIL SMOKE EVAC W/HOLSTER (ELECTROSURGICAL) ×3 IMPLANT
SUT MNCRL 0 VIOLET CTX 36 (SUTURE) ×2 IMPLANT
SUT MNCRL AB 3-0 PS2 27 (SUTURE) IMPLANT
SUT MON AB 2-0 CT1 27 (SUTURE) ×3 IMPLANT
SUT MON AB-0 CT1 36 (SUTURE) ×6 IMPLANT
SUT MONOCRYL 0 CTX 36 (SUTURE) ×4
SUT PLAIN 0 NONE (SUTURE) IMPLANT
SUT PLAIN 2 0 (SUTURE)
SUT PLAIN 2 0 XLH (SUTURE) IMPLANT
SUT PLAIN ABS 2-0 CT1 27XMFL (SUTURE) IMPLANT
SYR 20CC LL (SYRINGE) IMPLANT
SYR CONTROL 10ML LL (SYRINGE) ×3 IMPLANT
TOWEL OR 17X24 6PK STRL BLUE (TOWEL DISPOSABLE) ×3 IMPLANT
TRAY FOLEY BAG SILVER LF 14FR (SET/KITS/TRAYS/PACK) ×3 IMPLANT

## 2016-05-08 NOTE — Progress Notes (Signed)
Abigail George is a 31 y.o. G2P0010 at [redacted]w[redacted]d by LMP admitted for active labor, rupture of membranes  Subjective: Comfortable  Objective: BP 99/62   Pulse (!) 118   Temp 99 F (37.2 C) (Oral)   Resp 16   Ht  (1.778 m)   Wt 94.3 kg (208 lb)   LMP 08/08/2015   SpO2 99%   BMI 29.84 kg/m  No intake/output data recorded. No intake/output data recorded.  FHT:  FHR: 145 bpm, variability: moderate,  accelerations:  Present,  decelerations:  Absent UC:   regular, every 2 minutes SVE:   Dilation: 4.5 Effacement (%): 100 Station: -2 Exam by:: Kimber Esterly  No change  Labs: Lab Results  Component Value Date   WBC 11.6 (H) 05/08/2016   HGB 13.4 05/08/2016   HCT 37.1 05/08/2016   MCV 84.5 05/08/2016   PLT 228 05/08/2016    Assessment / Plan: Arrest in active phase of labor  Labor: no progress x 8h Preeclampsia:  no signs or symptoms of toxicity Fetal Wellbeing:  Category I Pain Control:  Epidural I/D:  n/a Anticipated MOD:  csection. Risks vs benefits discussed. Consent done.  Merle Cirelli J 05/08/2016, 9:09 PM

## 2016-05-08 NOTE — Anesthesia Preprocedure Evaluation (Signed)
Anesthesia Evaluation  Patient identified by MRN, date of birth, ID band Patient awake    Reviewed: Allergy & Precautions, H&P , NPO status , Patient's Chart, lab work & pertinent test results  Airway Mallampati: I  TM Distance: >3 FB Neck ROM: full    Dental no notable dental hx.    Pulmonary neg pulmonary ROS,    Pulmonary exam normal breath sounds clear to auscultation       Cardiovascular negative cardio ROS Normal cardiovascular exam Rhythm:regular Rate:Normal     Neuro/Psych negative neurological ROS  negative psych ROS   GI/Hepatic negative GI ROS, Neg liver ROS,   Endo/Other  negative endocrine ROS  Renal/GU negative Renal ROS     Musculoskeletal   Abdominal Normal abdominal exam  (+)   Peds  Hematology negative hematology ROS (+)   Anesthesia Other Findings   Reproductive/Obstetrics (+) Pregnancy                             Anesthesia Physical Anesthesia Plan  ASA: II  Anesthesia Plan: Epidural   Post-op Pain Management:    Induction:   Airway Management Planned:   Additional Equipment:   Intra-op Plan:   Post-operative Plan:   Informed Consent: I have reviewed the patients History and Physical, chart, labs and discussed the procedure including the risks, benefits and alternatives for the proposed anesthesia with the patient or authorized representative who has indicated his/her understanding and acceptance.     Plan Discussed with:   Anesthesia Plan Comments:         Anesthesia Quick Evaluation

## 2016-05-08 NOTE — Anesthesia Procedure Notes (Signed)
Epidural Patient location during procedure: OB Start time: 05/08/2016 3:22 PM End time: 05/08/2016 3:26 PM  Staffing Anesthesiologist: Leilani Able Performed: anesthesiologist   Preanesthetic Checklist Completed: patient identified, surgical consent, pre-op evaluation, timeout performed, IV checked, risks and benefits discussed and monitors and equipment checked  Epidural Patient position: sitting Prep: site prepped and draped and DuraPrep Patient monitoring: continuous pulse ox and blood pressure Approach: midline Location: L3-L4 Injection technique: LOR air  Needle:  Needle type: Tuohy  Needle gauge: 17 G Needle length: 9 cm and 9 Needle insertion depth: 5 cm cm Catheter type: closed end flexible Catheter size: 19 Gauge Catheter at skin depth: 11 cm Test dose: negative and Other  Assessment Sensory level: T9 Events: blood not aspirated, injection not painful, no injection resistance, negative IV test and no paresthesia  Additional Notes Reason for block:procedure for pain

## 2016-05-08 NOTE — Anesthesia Postprocedure Evaluation (Signed)
Anesthesia Post Note  Patient: Abigail George  Procedure(s) Performed: Procedure(s) (LRB): CESAREAN SECTION (N/A)  Patient location during evaluation: PACU Anesthesia Type: Epidural Level of consciousness: awake and alert, oriented and patient cooperative Pain management: pain level controlled Vital Signs Assessment: post-procedure vital signs reviewed and stable Respiratory status: spontaneous breathing, nonlabored ventilation and respiratory function stable Cardiovascular status: blood pressure returned to baseline and stable Postop Assessment: epidural receding Anesthetic complications: no        Last Vitals:  Vitals:   05/08/16 2254 05/08/16 2300  BP:  131/72  Pulse: 86 88  Resp: 18 20  Temp: 37 C     Last Pain:  Vitals:   05/08/16 2300  TempSrc:   PainSc: 0-No pain   Pain Goal: Patients Stated Pain Goal: 0 (05/08/16 1218)               Erling Cruz. Timon Geissinger

## 2016-05-08 NOTE — H&P (Signed)
Abigail George is a 31 y.o. female presenting for srom at term . OB History    Gravida Para Term Preterm AB Living   2 0 0 0 1 0   SAB TAB Ectopic Multiple Live Births   1 0 0 0       Past Medical History:  Diagnosis Date  . Anxiety   . Fibrocystic breast changes   . Migraines 12/2007   Past Surgical History:  Procedure Laterality Date  . IUD REMOVAL  05/16/14  . TONSILLECTOMY    . TYMPANOSTOMY TUBE PLACEMENT     Family History: family history is not on file. Social History:  reports that she has never smoked. She has never used smokeless tobacco. She reports that she drinks alcohol. She reports that she does not use drugs.     Maternal Diabetes: No Genetic Screening: Normal Maternal Ultrasounds/Referrals: Normal Fetal Ultrasounds or other Referrals:  None Maternal Substance Abuse:  No Significant Maternal Medications:  None Significant Maternal Lab Results:  None Other Comments:  None  Review of Systems  Constitutional: Negative.   All other systems reviewed and are negative.  Maternal Medical History:  Reason for admission: Rupture of membranes.   Contractions: Onset was less than 1 hour ago.   Frequency: regular.   Perceived severity is moderate.    Fetal activity: Perceived fetal activity is normal.   Last perceived fetal movement was within the past hour.    Prenatal complications: no prenatal complications Prenatal Complications - Diabetes: none.      Last menstrual period 08/08/2015. Maternal Exam:  Uterine Assessment: Contraction strength is moderate.  Contraction frequency is regular.   Abdomen: Patient reports no abdominal tenderness. Fetal presentation: vertex  Introitus: Normal vulva. Normal vagina.  Ferning test: positive.  Nitrazine test: positive. Amniotic fluid character: meconium stained.  Pelvis: of concern for delivery.   Cervix: Cervix evaluated by digital exam.     Physical Exam  Vitals reviewed. Constitutional: She is  oriented to person, place, and time.  HENT:  Head: Normocephalic and atraumatic.  Neck: Normal range of motion. Neck supple.  Cardiovascular: Normal rate and regular rhythm.   Respiratory: Effort normal and breath sounds normal.  GI: Soft. Bowel sounds are normal.  Genitourinary: Vagina normal and uterus normal.  Neurological: She is alert and oriented to person, place, and time.  Skin: Skin is warm and dry.  Psychiatric: She has a normal mood and affect.    Prenatal labs: ABO, Rh: O/Positive/-- (09/28 0000) Antibody: Negative (09/28 0000) Rubella: Immune (09/28 0000) RPR: Nonreactive (09/28 0000)  HBsAg: Negative (09/28 0000)  HIV: Non-reactive (09/28 0000)  GBS: Positive (03/22 0000)   Assessment/Plan: Term IUP Meconium SROM LGA Admit   Jayshon Dommer J 05/08/2016, 12:31 PM

## 2016-05-08 NOTE — Progress Notes (Signed)
Abigail George is a 31 y.o. G2P0010 at [redacted]w[redacted]d by LMP admitted for active labor, rupture of membranes  Subjective: comfortable  Objective: BP 113/76   Pulse 85   Temp 98.8 F (37.1 C) (Oral)   Resp 16   Ht  (1.778 m)   Wt 94.3 kg (208 lb)   LMP 08/08/2015   SpO2 99%   BMI 29.84 kg/m  No intake/output data recorded. No intake/output data recorded.  FHT:  FHR: 135 bpm, variability: moderate,  accelerations:  Present,  decelerations:  Absent UC:   regular, every 2-4 minutes SVE:   Dilation: 4.5 Effacement (%): 100 Station: -2 Exam by:: Paul Torpey  Unable to place IUPC x 3 attempts  Labs: Lab Results  Component Value Date   WBC 11.6 (H) 05/08/2016   HGB 13.4 05/08/2016   HCT 37.1 05/08/2016   MCV 84.5 05/08/2016   PLT 228 05/08/2016    Assessment / Plan: Protracted active phase  Labor: no change x 5hr Preeclampsia:  no signs or symptoms of toxicity Fetal Wellbeing:  Category I Pain Control:  Epidural I/D:  n/a Anticipated MOD:  likely csection  Abigail George 05/08/2016, 6:41 PM

## 2016-05-08 NOTE — Progress Notes (Signed)
Pt got up to BR, feeling very dizzy, nauseated & diaphoretic.  Pt assisted back to bed, cold washcloth on neck, Pt starting to feel some better.

## 2016-05-08 NOTE — Op Note (Signed)
Cesarean Section Procedure Note  Indications: failure to progress: arrest of dilation  Pre-operative Diagnosis: 39 week 2 day pregnancy.  Post-operative Diagnosis: same  Surgeon: Lenoard Aden   Assistants: Fredric Mare, cnm  Anesthesia: Epidural anesthesia and Local anesthesia 0.25.% bupivacaine, 0.5% bupivacaine  ASA Class: 2  Procedure Details  The patient was seen in the Holding Room. The risks, benefits, complications, treatment options, and expected outcomes were discussed with the patient.  The patient concurred with the proposed plan, giving informed consent. The risks of anesthesia, infection, bleeding and possible injury to other organs discussed. Injury to bowel, bladder, or ureter with possible need for repair discussed. Possible need for transfusion with secondary risks of hepatitis or HIV acquisition discussed. Post operative complications to include but not limited to DVT, PE and Pneumonia noted. The site of surgery properly noted/marked. The patient was taken to Operating Room # 1, identified as Abigail George and the procedure verified as C-Section Delivery. A Time Out was held and the above information confirmed.  After induction of anesthesia, the patient was draped and prepped in the usual sterile manner. A Pfannenstiel incision was made and carried down through the subcutaneous tissue to the fascia. Fascial incision was made and extended transversely using Mayo scissors. The fascia was separated from the underlying rectus tissue superiorly and inferiorly. The peritoneum was identified and entered. Peritoneal incision was extended longitudinally. The utero-vesical peritoneal reflection was incised transversely and the bladder flap was bluntly freed from the lower uterine segment. A low transverse uterine incision(Kerr hysterotomy) was made. Delivered from op presentation was a  female with Apgar scores of 8 at one minute and 9 at five minutes. Bulb suctioning gently  performed. Neonatal team in attendance.After the umbilical cord was clamped and cut cord blood was obtained for evaluation. The placenta was removed intact and appeared normal. The uterus was curetted with a dry lap pack. Good hemostasis was noted.The uterine outline, tubes and ovaries appeared normal. The uterine incision was closed with running locked sutures of 0 Monocryl x 2 layers. Hemostasis was observed. Lavage was carried out until clear.The parietal peritoneum was closed with a running 2-0 Monocryl suture. The fascia was then reapproximated with running sutures of 0 Monocryl. The skin was reapproximated with 3-0 monocryl after  closure with 2-0 plain.  Instrument, sponge, and needle counts were correct prior the abdominal closure and at the conclusion of the case.   Findings: Above  Estimated Blood Loss:  600         Drains: foley                 Specimens: placenta                 Complications:  None; patient tolerated the procedure well.         Disposition: PACU - hemodynamically stable.         Condition: stable  Attending Attestation: I performed the procedure.

## 2016-05-08 NOTE — Transfer of Care (Signed)
Immediate Anesthesia Transfer of Care Note  Patient: [name]  Procedure(s) Performed: Procedure(s): CESAREAN SECTION (N/A)  Patient Location: PACU  Anesthesia Type:Epidural  Level of Consciousness: awake, alert  and oriented  Airway & Oxygen Therapy: Patient Spontanous Breathing  Post-op Assessment: Report given to RN and Post -op Vital signs reviewed and stable  Post vital signs: Reviewed and stable  Last Vitals:  Vitals:   05/08/16 2030 05/08/16 2100  BP: 99/62 (!) 147/81  Pulse: (!) 118 91  Resp:    Temp:      Last Pain:  Vitals:   05/08/16 1940  TempSrc: Oral  PainSc:       Patients Stated Pain Goal: 0 (05/08/16 1218)  Complications: No apparent anesthesia complications

## 2016-05-08 NOTE — MAU Note (Signed)
Pt reports her water broke around 9:30. Mech stained. C/o ctx about q 5 min

## 2016-05-09 ENCOUNTER — Encounter (HOSPITAL_COMMUNITY): Payer: Self-pay | Admitting: *Deleted

## 2016-05-09 LAB — CBC
HEMATOCRIT: 28.2 % — AB (ref 36.0–46.0)
Hemoglobin: 10.2 g/dL — ABNORMAL LOW (ref 12.0–15.0)
MCH: 30.6 pg (ref 26.0–34.0)
MCHC: 36.2 g/dL — ABNORMAL HIGH (ref 30.0–36.0)
MCV: 84.7 fL (ref 78.0–100.0)
PLATELETS: 193 10*3/uL (ref 150–400)
RBC: 3.33 MIL/uL — AB (ref 3.87–5.11)
RDW: 12.7 % (ref 11.5–15.5)
WBC: 19.1 10*3/uL — ABNORMAL HIGH (ref 4.0–10.5)

## 2016-05-09 LAB — RPR: RPR: NONREACTIVE

## 2016-05-09 MED ORDER — PRENATAL MULTIVITAMIN CH
1.0000 | ORAL_TABLET | Freq: Every day | ORAL | Status: DC
  Administered 2016-05-09 – 2016-05-10 (×2): 1 via ORAL
  Filled 2016-05-09 (×2): qty 1

## 2016-05-09 MED ORDER — ACETAMINOPHEN 325 MG PO TABS
650.0000 mg | ORAL_TABLET | ORAL | Status: DC | PRN
  Filled 2016-05-09: qty 2

## 2016-05-09 MED ORDER — IBUPROFEN 600 MG PO TABS
600.0000 mg | ORAL_TABLET | Freq: Four times a day (QID) | ORAL | Status: DC
  Administered 2016-05-09 – 2016-05-11 (×9): 600 mg via ORAL
  Filled 2016-05-09 (×9): qty 1

## 2016-05-09 MED ORDER — DIPHENHYDRAMINE HCL 25 MG PO CAPS
25.0000 mg | ORAL_CAPSULE | ORAL | Status: DC | PRN
  Filled 2016-05-09: qty 1

## 2016-05-09 MED ORDER — LACTATED RINGERS IV SOLN
INTRAVENOUS | Status: DC
  Administered 2016-05-09: 990 mL via INTRAVENOUS

## 2016-05-09 MED ORDER — OXYTOCIN 40 UNITS IN LACTATED RINGERS INFUSION - SIMPLE MED: 2.5000 [IU]/h | INTRAVENOUS | Status: AC

## 2016-05-09 MED ORDER — SIMETHICONE 80 MG PO CHEW
80.0000 mg | CHEWABLE_TABLET | ORAL | Status: DC | PRN
  Administered 2016-05-11: 80 mg via ORAL

## 2016-05-09 MED ORDER — MORPHINE SULFATE (PF) 4 MG/ML IV SOLN: 1.0000 mg | INTRAVENOUS | Status: DC | PRN

## 2016-05-09 MED ORDER — ZOLPIDEM TARTRATE 5 MG PO TABS: 5.0000 mg | ORAL_TABLET | Freq: Every evening | ORAL | Status: DC | PRN

## 2016-05-09 MED ORDER — SENNOSIDES-DOCUSATE SODIUM 8.6-50 MG PO TABS
2.0000 | ORAL_TABLET | ORAL | Status: DC
  Administered 2016-05-09 – 2016-05-11 (×2): 2 via ORAL
  Filled 2016-05-09 (×2): qty 2

## 2016-05-09 MED ORDER — DIPHENHYDRAMINE HCL 25 MG PO CAPS
25.0000 mg | ORAL_CAPSULE | Freq: Four times a day (QID) | ORAL | Status: DC | PRN
  Administered 2016-05-09: 25 mg via ORAL

## 2016-05-09 MED ORDER — NALBUPHINE HCL 10 MG/ML IJ SOLN: 5.0000 mg | Freq: Once | INTRAMUSCULAR | Status: DC | PRN

## 2016-05-09 MED ORDER — SODIUM CHLORIDE 0.9% FLUSH: 3.0000 mL | INTRAVENOUS | Status: DC | PRN

## 2016-05-09 MED ORDER — NALOXONE HCL 0.4 MG/ML IJ SOLN: 0.4000 mg | INTRAMUSCULAR | Status: DC | PRN

## 2016-05-09 MED ORDER — ONDANSETRON HCL 4 MG/2ML IJ SOLN
4.0000 mg | Freq: Three times a day (TID) | INTRAMUSCULAR | Status: DC | PRN
Start: 2016-05-09 — End: 2016-05-11

## 2016-05-09 MED ORDER — MIDAZOLAM HCL 2 MG/2ML IJ SOLN: 0.5000 mg | Freq: Once | INTRAMUSCULAR | Status: DC | PRN

## 2016-05-09 MED ORDER — NALBUPHINE HCL 10 MG/ML IJ SOLN
5.0000 mg | INTRAMUSCULAR | Status: DC | PRN
Start: 2016-05-09 — End: 2016-05-11
  Administered 2016-05-09: 5 mg via INTRAVENOUS
  Filled 2016-05-09: qty 1

## 2016-05-09 MED ORDER — METHYLERGONOVINE MALEATE 0.2 MG PO TABS: 0.2000 mg | ORAL_TABLET | ORAL | Status: DC | PRN

## 2016-05-09 MED ORDER — MENTHOL 3 MG MT LOZG: 1.0000 | LOZENGE | OROMUCOSAL | Status: DC | PRN

## 2016-05-09 MED ORDER — PROMETHAZINE HCL 25 MG/ML IJ SOLN: 6.2500 mg | INTRAMUSCULAR | Status: DC | PRN

## 2016-05-09 MED ORDER — COCONUT OIL OIL
1.0000 "application " | TOPICAL_OIL | Status: DC | PRN
  Administered 2016-05-10: 1 via TOPICAL
  Filled 2016-05-09: qty 120

## 2016-05-09 MED ORDER — TETANUS-DIPHTH-ACELL PERTUSSIS 5-2.5-18.5 LF-MCG/0.5 IM SUSP
0.5000 mL | Freq: Once | INTRAMUSCULAR | Status: DC
Start: 2016-05-09 — End: 2016-05-11

## 2016-05-09 MED ORDER — NALOXONE HCL 2 MG/2ML IJ SOSY
1.0000 ug/kg/h | PREFILLED_SYRINGE | INTRAMUSCULAR | Status: DC | PRN
  Filled 2016-05-09: qty 2

## 2016-05-09 MED ORDER — NALBUPHINE HCL 10 MG/ML IJ SOLN
5.0000 mg | Freq: Once | INTRAMUSCULAR | Status: DC | PRN
Start: 2016-05-09 — End: 2016-05-11

## 2016-05-09 MED ORDER — OXYCODONE-ACETAMINOPHEN 5-325 MG PO TABS
1.0000 | ORAL_TABLET | ORAL | Status: DC | PRN
Start: 2016-05-09 — End: 2016-05-09
  Administered 2016-05-09: 1 via ORAL
  Filled 2016-05-09: qty 1

## 2016-05-09 MED ORDER — HYDROCODONE-ACETAMINOPHEN 5-325 MG PO TABS
1.0000 | ORAL_TABLET | ORAL | Status: DC | PRN
  Administered 2016-05-09: 1 via ORAL
  Administered 2016-05-09 – 2016-05-10 (×2): 2 via ORAL
  Administered 2016-05-10 (×2): 1 via ORAL
  Administered 2016-05-10: 2 via ORAL
  Administered 2016-05-11: 1 via ORAL
  Filled 2016-05-09 (×3): qty 2
  Filled 2016-05-09 (×4): qty 1

## 2016-05-09 MED ORDER — NALBUPHINE HCL 10 MG/ML IJ SOLN: 5.0000 mg | INTRAMUSCULAR | Status: DC | PRN

## 2016-05-09 MED ORDER — DIPHENHYDRAMINE HCL 50 MG/ML IJ SOLN: 12.5000 mg | INTRAMUSCULAR | Status: DC | PRN

## 2016-05-09 MED ORDER — SIMETHICONE 80 MG PO CHEW
80.0000 mg | CHEWABLE_TABLET | ORAL | Status: DC
  Administered 2016-05-09: 80 mg via ORAL
  Filled 2016-05-09 (×2): qty 1

## 2016-05-09 MED ORDER — MEPERIDINE HCL 25 MG/ML IJ SOLN: 6.2500 mg | INTRAMUSCULAR | Status: DC | PRN

## 2016-05-09 MED ORDER — WITCH HAZEL-GLYCERIN EX PADS: 1.0000 "application " | MEDICATED_PAD | CUTANEOUS | Status: DC | PRN

## 2016-05-09 MED ORDER — METHYLERGONOVINE MALEATE 0.2 MG/ML IJ SOLN
0.2000 mg | INTRAMUSCULAR | Status: DC | PRN
Start: 2016-05-09 — End: 2016-05-11

## 2016-05-09 MED ORDER — DIBUCAINE 1 % RE OINT: 1.0000 "application " | TOPICAL_OINTMENT | RECTAL | Status: DC | PRN

## 2016-05-09 MED ORDER — OXYCODONE-ACETAMINOPHEN 5-325 MG PO TABS: 2.0000 | ORAL_TABLET | ORAL | Status: DC | PRN

## 2016-05-09 MED ORDER — SCOPOLAMINE 1 MG/3DAYS TD PT72
1.0000 | MEDICATED_PATCH | Freq: Once | TRANSDERMAL | Status: DC
  Filled 2016-05-09: qty 1

## 2016-05-09 MED ORDER — SIMETHICONE 80 MG PO CHEW
80.0000 mg | CHEWABLE_TABLET | Freq: Three times a day (TID) | ORAL | Status: DC
  Administered 2016-05-09 – 2016-05-10 (×6): 80 mg via ORAL
  Filled 2016-05-09 (×6): qty 1

## 2016-05-09 NOTE — Lactation Note (Signed)
This note was copied from a baby's chart. Lactation Consultation Note  Baby 15 hours old.  Assisted w/ latching in football hold. Mother anxious due to some dusky episodes after birth in PACU when breastfeeding. Mother does not allow baby to come too close on breast due to concerns about breathing while breastfeeding. Encouraged mother to compress breast during feeding to keep baby active. Noted some mid posterior lingual tightness but baby can protrude tongue out of mouth. Baby comes off and on breast during feeding.  Parents aware to check lips to flange. Observed some swallows.  Discussed basics including un-latching and cluster feeding. Mom encouraged to feed baby 8-12 times/24 hours and with feeding cues.  Mom made aware of O/P services, breastfeeding support groups, community resources, and our phone # for post-discharge questions.       Patient Name: Abigail George [id number] Date: 05/09/2016 Reason for consult: Initial assessment   Maternal Data Has patient been taught Hand Expression?: Yes Does the patient have breastfeeding experience prior to this delivery?: No  Feeding Feeding Type: Breast Fed Length of feed: 12 min  LATCH Score/Interventions Latch: Repeated attempts needed to sustain latch, nipple held in mouth throughout feeding, stimulation needed to elicit sucking reflex. Intervention(s): Adjust position;Assist with latch;Breast massage;Breast compression  Audible Swallowing: A few with stimulation Intervention(s): Skin to skin;Hand expression  Type of Nipple: Everted at rest and after stimulation  Comfort (Breast/Nipple): Soft / non-tender     Hold (Positioning): Assistance needed to correctly position infant at breast and maintain latch. Intervention(s): Breastfeeding basics reviewed;Support Pillows;Position options;Skin to skin  LATCH Score: 7  Lactation Tools Discussed/Used     Consult Status Consult Status: Follow-up Date:  05/10/16 Follow-up type: In-patient    Dahlia Byes [place] 05/09/2016, 1:54 PM

## 2016-05-09 NOTE — Progress Notes (Signed)
SVD: primary  S:  Pt reports feeling ok. Passed a med size formed clot. Just tried one percocet for pain / Tolerating po/ Voiding without problems/ No n/v/ Bleeding is mod / Pain controlled withnarcotic analgesics including oxycodone/acetaminophen (Percocet, Tylox)    O:  A & O x 3  well VS: Blood pressure (!) 113/48, pulse 68, temperature 98.2 F (36.8 C), temperature source Oral, resp. rate 18, height  (1.778 m), weight 94.3 kg (208 lb), last menstrual period 08/08/2015, SpO2 99 %, unknown if currently breastfeeding.  LABS:  Results for orders placed or performed during the hospital encounter of 05/08/16 (from the past 24 hour(s))  CBC     Status: Abnormal   Collection Time: 05/09/16  6:01 AM  Result Value Ref Range   WBC 19.1 (H) 4.0 - 10.5 K/uL   RBC 3.33 (L) 3.87 - 5.11 MIL/uL   Hemoglobin 10.2 (L) 12.0 - 15.0 g/dL   HCT 16.1 (L) 09.6 - 04.5 %   MCV 84.7 78.0 - 100.0 fL   MCH 30.6 26.0 - 34.0 pg   MCHC 36.2 (H) 30.0 - 36.0 g/dL   RDW 40.9 81.1 - 91.4 %   Platelets 193 150 - 400 K/uL    I&O: I/O last 3 completed shifts: In: 860 [P.O.:360; I.V.:500] Out: 1700 [Urine:1000; Blood:700]   Total I/O In: -  Out: 2000 [Urine:2000]  Lungs: chest clear, no wheezing, rales, normal symmetric air entry, Heart exam - S1, S2 normal, no murmur, no gallop, rate regular  Heart: regular rate and rhythm, S1, S2 normal, no murmur, click, rub or gallop  Abdomen: soft distended. Primary dressing d/c/i  Perineum: is normal  Lochia: just changed  Extremities:edema 1+    A/P: POD # 1/PPD # 1/ G2P1011  Doing well  Continue routine post partum orders Need to ambulate  Pp booklet given

## 2016-05-09 NOTE — Anesthesia Postprocedure Evaluation (Addendum)
Anesthesia Post Note  Patient: Abigail George  Procedure(s) Performed: Procedure(s) (LRB): CESAREAN SECTION (N/A)  Patient location during evaluation: Mother Baby Anesthesia Type: Epidural Level of consciousness: awake and alert, oriented and patient cooperative Pain management: pain level controlled Vital Signs Assessment: post-procedure vital signs reviewed and stable Respiratory status: spontaneous breathing Cardiovascular status: stable Postop Assessment: no headache, epidural receding, patient able to bend at knees and no signs of nausea or vomiting Anesthetic complications: no Comments: Pain Score 0.          Last Vitals:  Vitals:   05/09/16 0230 05/09/16 0410  BP: 114/61   Pulse: 75 73  Resp: 16 16  Temp: 36.9 C 37.1 C    Last Pain:  Vitals:   05/09/16 0030  TempSrc: Axillary  PainSc: 4    Pain Goal: Patients Stated Pain Goal: 0 (05/08/16 1218)               Merrilyn Puma

## 2016-05-09 NOTE — Addendum Note (Signed)
Addendum  created 05/09/16 0818 by Angela Adam, CRNA   Sign clinical note

## 2016-05-10 ENCOUNTER — Encounter (HOSPITAL_COMMUNITY): Payer: Self-pay | Admitting: Obstetrics and Gynecology

## 2016-05-10 LAB — BIRTH TISSUE RECOVERY COLLECTION (PLACENTA DONATION)

## 2016-05-10 MED ORDER — BISACODYL 10 MG RE SUPP
10.0000 mg | Freq: Once | RECTAL | Status: AC
  Administered 2016-05-10: 10 mg via RECTAL
  Filled 2016-05-10: qty 1

## 2016-05-10 NOTE — Progress Notes (Signed)
Subjective: POD# 2 Information for the patient's newborn:  Taytum, Scheck [[id number]]  female   Baby name: Rubye Oaks  Reports feeling sore but well.  Feeding: breast Patient reports tolerating PO.  Breast symptoms:none Pain controlled withibuprofen (OTC) and percocet Denies HA/SOB/C/P/N/V/dizziness. Flatus present, no BM. She reports vaginal bleeding as normal, without clots.  She is ambulating, urinating without difficulty.     Objective:   VS:    Vitals:   05/09/16 1330 05/09/16 1800 05/09/16 2100 05/10/16 0552  BP: (!) 110/57 (!) 109/56 (!) 114/55 (!) 114/55  Pulse: 67 79 70 70  Resp: Temp: 98.3 F (36.8 C) 98.6 F (37 C) 98.5 F (36.9 C) 98.7 F (37.1 C)  TempSrc: Oral Oral    SpO2: 98% 98%    Weight:      Height:         Intake/Output Summary (Last 24 hours) at 05/10/16 1036 Last data filed at 05/09/16 1545  Gross per 24 hour  Intake          1788.33 ml  Output              950 ml  Net           838.33 ml        Recent Labs  05/08/16 1255 05/09/16 0601  WBC 11.6* 19.1*  HGB 13.4 10.2*  HCT 37.1 28.2*  PLT 228 193     Blood type: --/--/O POS (04/19 1255)  Rubella: Immune (09/28 0000)     Physical Exam:  General: alert, cooperative and no distress Abdomen: mod gas distention, + BS Incision: clean, dry and intact Uterine Fundus: firm, below umbilicus, nontender Lochia: minimal Ext: edema +1 pedal and Homans sign is negative, no sign of DVT      Assessment/Plan: 31 y.o.   POD# 2. [id number]                  Principal Problem:   Postpartum care following cesarean delivery (4/19) Active Problems:   Cesarean delivery delivered -indication arrest of labor   Doing well, stable.               Advance diet as tolerated Encourage rest when baby rests Breastfeeding support Encourage to ambulate Routine post-op care  Neta Mends, CNM, MSN 05/10/2016, 10:36 AM

## 2016-05-11 MED ORDER — IBUPROFEN 600 MG PO TABS: 600.0000 mg | ORAL_TABLET | Freq: Four times a day (QID) | ORAL | 0 refills | Status: DC

## 2016-05-11 MED ORDER — COCONUT OIL OIL: 1.0000 "application " | TOPICAL_OIL | 0 refills | Status: DC | PRN

## 2016-05-11 MED ORDER — SENNOSIDES-DOCUSATE SODIUM 8.6-50 MG PO TABS: 2.0000 | ORAL_TABLET | ORAL | Status: DC

## 2016-05-11 MED ORDER — HYDROCODONE-ACETAMINOPHEN 5-325 MG PO TABS: 1.0000 | ORAL_TABLET | ORAL | 0 refills | Status: DC | PRN

## 2016-05-11 MED ORDER — SIMETHICONE 80 MG PO CHEW: 80.0000 mg | CHEWABLE_TABLET | Freq: Three times a day (TID) | ORAL | 0 refills | Status: DC

## 2016-05-11 NOTE — Progress Notes (Signed)
Subjective: POD# 3 Information for the patient's newborn:  Abigail George, Abigail George [[id number]]  female  Baby name: Rubye Oaks  Reports feeling better today, ready for DC home Feeding: breast Patient reports tolerating PO.  Breast symptoms: + milk Pain controlled with PO meds Denies HA/SOB/C/P/N/V/dizziness. Flatus present, no BM. She reports vaginal bleeding as normal, without clots.  She is ambulating, urinating without difficulty.     Objective:   VS:    Vitals:   05/09/16 2100 05/10/16 0552 05/10/16 1722 05/11/16 0500  BP: (!) 114/55 (!) 114/55 136/69 123/68  Pulse: 70 70 75 78  Resp: Temp: 98.5 F (36.9 C) 98.7 F (37.1 C) 99 F (37.2 C) 98.4 F (36.9 C)  TempSrc:    Oral  SpO2:      Weight:      Height:        No intake or output data in the 24 hours ending 05/11/16 0929      Recent Labs  05/08/16 1255 05/09/16 0601  WBC 11.6* 19.1*  HGB 13.4 10.2*  HCT 37.1 28.2*  PLT 228 193     Blood type: --/--/O POS (04/19 1255)  Rubella: Immune (09/28 0000)     Physical Exam:  General: alert, cooperative and no distress Abdomen: soft, nontender, normal bowel sounds Incision: clean, dry and intact Uterine Fundus: firm, below umbilicus, nontender Lochia: minimal Ext: edema +2 pedal and Homans sign is negative, no sign of DVT      Assessment/Plan: 31 y.o.   POD# 3. [id number]                  Principal Problem:   Postpartum care following cesarean delivery (4/19) Active Problems:   Cesarean delivery delivered -indication arrest of labor   Doing well, stable.                           DC home today w/ instructions  F/U at [facility] in 6 weeks and PRN   Neta Mends, CNM, MSN 05/11/2016, 9:29 AM

## 2016-05-11 NOTE — Discharge Summary (Signed)
OB Discharge Summary     Patient Name: Abigail George DOB: 10/19/1985 MRN: [id number]  Date of admission: 05/08/2016 Delivering MD: Olivia Mackie   Date of discharge: 05/11/2016  Admitting diagnosis: WATER BROKE Intrauterine pregnancy: [redacted]w[redacted]d     Secondary diagnosis:  Principal Problem:   Postpartum care following cesarean delivery (4/19) Active Problems:   Cesarean delivery delivered -indication arrest of labor     Discharge diagnosis: Term Pregnancy Delivered and post-op day 3, stable               Augmentation: Pitocin  Complications: None  Hospital course:  Onset of Labor With Unplanned C/S  31 y.o. yo G2P1011 at [redacted]w[redacted]d was admitted in Active Labor on 05/08/2016. Patient had a labor course significant for protracted active phase of labor. Membrane Rupture Time/Date: 9:30 AM ,05/08/2016   The patient went for cesarean section due to Arrest of Dilation at 6 cm, and delivered a Viable girl infant,05/08/2016  Details of operation can be found in separate operative note. Patient had an uncomplicated postpartum course.  She is ambulating,tolerating a regular diet, passing flatus, and urinating well.  Patient is discharged home in stable condition 05/11/16.  Physical exam  Vitals:   05/09/16 2100 05/10/16 0552 05/10/16 1722 05/11/16 0500  BP: (!) 114/55 (!) 114/55 136/69 123/68  Pulse: 70 70 75 78  Resp: Temp: 98.5 F (36.9 C) 98.7 F (37.1 C) 99 F (37.2 C) 98.4 F (36.9 C)  TempSrc:    Oral  SpO2:      Weight:      Height:       General: alert, cooperative and no distress Lochia: appropriate Uterine Fundus: firm Incision: Healing well with no significant drainage, Dressing is clean, dry, and intact DVT Evaluation: No cords or calf tenderness. Calf/Ankle edema is present Labs: Lab Results  Component Value Date   WBC 19.1 (H) 05/09/2016   HGB 10.2 (L) 05/09/2016   HCT 28.2 (L) 05/09/2016   MCV 84.7 05/09/2016   PLT 193 05/09/2016   CMP Latest  Ref Rng & Units 01/07/2015  Glucose 65 - 99 mg/dL 99  BUN 6 - 20 mg/dL 12  Creatinine 6.29 - 5.28 mg/dL 4.13  Sodium 244 - 010 mmol/L 139  Potassium 3.5 - 5.1 mmol/L 4.2  Chloride 101 - 111 mmol/L 104  CO2 22 - 32 mmol/L 27  Calcium 8.9 - 10.3 mg/dL 9.9  Total Protein 6.5 - 8.1 g/dL 7.7  Total Bilirubin 0.3 - 1.2 mg/dL 1.1  Alkaline Phos 38 - 126 U/L 45  AST 15 - 41 U/L 21  ALT 14 - 54 U/L 17    Discharge instruction: per After Visit Summary and "Baby and Me Booklet".  After visit meds:  Allergies as of 05/11/2016      Reactions   Amoxapine And Related Rash   Amoxicillin Hives, Other (See Comments)   Has patient had a PCN reaction causing immediate rash, facial/tongue/throat swelling, SOB or lightheadedness with hypotension: No Has patient had a PCN reaction causing severe rash involving mucus membranes or skin necrosis: No Has patient had a PCN reaction that required hospitalization No Has patient had a PCN reaction occurring within the last 10 years: Yes If all of the above answers are "NO", then may proceed with Cephalosporin use. Lip swelling      Medication List    TAKE these medications   acidophilus Caps capsule Take 1 capsule by mouth daily.   coconut  oil Oil Apply 1 application topically as needed.   diphenhydrAMINE 25 MG tablet Commonly known as:  BENADRYL Take 25 mg by mouth every 6 (six) hours as needed for allergies.   fluticasone 50 MCG/ACT nasal spray Commonly known as:  FLONASE USE 1-2 SPRAYS EACH NOSTRIL IN THE EVENING.   HYDROcodone-acetaminophen 5-325 MG tablet Commonly known as:  NORCO/VICODIN Take 1-2 tablets by mouth every 4 (four) hours as needed for moderate pain.   ibuprofen 600 MG tablet Commonly known as:  ADVIL,MOTRIN Take 1 tablet (600 mg total) by mouth every 6 (six) hours.   prenatal multivitamin Tabs tablet Take 1 tablet by mouth daily at 12 noon.   senna-docusate 8.6-50 MG tablet Commonly known as:  Senokot-S Take 2 tablets  by mouth daily. Start taking on:  05/12/2016   simethicone 80 MG chewable tablet Commonly known as:  MYLICON Chew 1 tablet (80 mg total) by mouth 3 (three) times daily after meals.       Diet: routine diet  Activity: Advance as tolerated. Pelvic rest for 6 weeks.   Outpatient follow up:6 weeks at [facility] Follow up Appt:No future appointments. Follow up Visit:No Follow-up on file.  Postpartum contraception: Not Discussed  Newborn Data: Live born female [place] Birth Weight: 9 lb 6.8 oz (4275 g) APGAR: 9, 9  Baby Feeding: Breast Disposition:home with mother   05/11/2016 Neta Mends, CNM

## 2016-08-06 ENCOUNTER — Encounter: Payer: Self-pay | Admitting: Family Medicine

## 2016-08-06 ENCOUNTER — Ambulatory Visit (INDEPENDENT_AMBULATORY_CARE_PROVIDER_SITE_OTHER): Payer: BC Managed Care – PPO | Admitting: Family Medicine

## 2016-08-06 DIAGNOSIS — M25551 Pain in right hip: Secondary | ICD-10-CM | POA: Diagnosis not present

## 2016-08-06 NOTE — Patient Instructions (Signed)
You have SI joint dysfunction and hip external rotator strain. Do stretches twice a day - pick 2-3 and hold for 20-30 seconds, repeat 3 times. Heat typically helps more than ice 15 minutes at a time 3-4 times a day. Advil 600mg  three times a day with food as needed for pain and inflammation. Standing hip rotations and hip side raises 3 sets of 10 once a day. Add ankle weight if these are too easy. Consider physical therapy if not improving. Keep appointment for your massage next week. Typically follow up in 4-6 weeks for reevaluation.

## 2016-08-06 NOTE — Progress Notes (Signed)
PCP: Tally Joe, MD  Subjective:   HPI: Patient is a 31 y.o. female here for low back/right hip pain.  Patient reports for about 1 weeks he's had posterior right hip/back pain. No known injury or trauma. Is trying to get back into running after having a child. Only up to running about 5 miles a week before this started. Pain radiates only into this hip. No numbness or tingling. No bowel/bladder dysfunction. Pain up to 7-8/10 and sharp. Worse with walking, sitting, standing, lying down. No benefit with foam rolling. advil helps some. No skin changes, bruising.  Past Medical History:  Diagnosis Date  . Anxiety   . Fibrocystic breast changes   . Migraines 12/2007    Current Outpatient Prescriptions on File Prior to Visit  Medication Sig Dispense Refill  . Prenatal Vit-Fe Fumarate-FA (PRENATAL MULTIVITAMIN) TABS tablet Take 1 tablet by mouth daily at 12 noon.     No current facility-administered medications on file prior to visit.     Past Surgical History:  Procedure Laterality Date  . CESAREAN SECTION N/A 05/08/2016   Procedure: CESAREAN SECTION;  Surgeon: Olivia Mackie, MD;  Location: [facility] BIRTHING SUITES;  Service: Obstetrics;  Laterality: N/A;  . IUD REMOVAL  05/16/14  . TONSILLECTOMY    . TYMPANOSTOMY TUBE PLACEMENT      Allergies  Allergen Reactions  . Amoxapine And Related Rash  . Amoxicillin Hives and Other (See Comments)    Has patient had a PCN reaction causing immediate rash, facial/tongue/throat swelling, SOB or lightheadedness with hypotension: No Has patient had a PCN reaction causing severe rash involving mucus membranes or skin necrosis: No Has patient had a PCN reaction that required hospitalization No Has patient had a PCN reaction occurring within the last 10 years: Yes If all of the above answers are "NO", then may proceed with Cephalosporin use. Lip swelling    Social History   Social History  . Marital status: Married    Spouse name: N/A   . Number of children: N/A  . Years of education: N/A   Occupational History  . Not on file.   Social History Main Topics  . Smoking status: Never Smoker  . Smokeless tobacco: Never Used  . Alcohol use 0.0 oz/week     Comment: less than 1 a week  . Drug use: No  . Sexual activity: Yes    Partners: Male    Birth control/ protection: None   Other Topics Concern  . Not on file   Social History Narrative  . No narrative on file    No family history on file.  BP 133/82   Pulse 68   Ht 5\' 9"  (1.753 m)   Wt 180 lb (81.6 kg)   BMI 26.58 kg/m   Review of Systems: See HPI above.     Objective:  Physical Exam:  Gen: NAD, comfortable in exam room  Back/right hip: No gross deformity, scoliosis. TTP just lateral to right SI joint.  No midline or bony TTP.  No other tenderness. FROM without pain back or passive hip motions. Strength LEs 5/5 all muscle groups.   2+ MSRs in patellar and achilles tendons, equal bilaterally. Negative SLRs. Sensation intact to light touch bilaterally. Negative logroll bilateral hips Positive fabers with mild pain only piriformis stretch. Mild pain with hop test but posterior just lateral to SI joint.   Assessment & Plan:  1. 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  Only  running 5 miles at most a week - pelvic stress fracture very unlikely.  She would like to start with home exercise program and stretches.  Heat, advil.  Consider physical therapy if not improving.  F/u in 4-6 weeks.

## 2016-08-06 NOTE — Assessment & Plan Note (Signed)
Consistent with SI joint dysfunction and hip external rotator strain.  Only running 5 miles at most a week - pelvic stress fracture very unlikely.  She would like to start with home exercise program and stretches.  Heat, advil.  Consider physical therapy if not improving.  F/u in 4-6 weeks.

## 2016-08-07 NOTE — Addendum Note (Signed)
Addendum  created 08/07/16 1746 by Jairo BenJackson, Daine Gunther, MD   Sign clinical note

## 2016-10-29 ENCOUNTER — Encounter: Payer: Self-pay | Admitting: Neurology

## 2016-10-29 ENCOUNTER — Ambulatory Visit (INDEPENDENT_AMBULATORY_CARE_PROVIDER_SITE_OTHER): Payer: BC Managed Care – PPO | Admitting: Neurology

## 2016-10-29 VITALS — BP 121/74 | HR 68 | Ht 67.0 in | Wt 172.0 lb

## 2016-10-29 DIAGNOSIS — G43709 Chronic migraine without aura, not intractable, without status migrainosus: Secondary | ICD-10-CM

## 2016-10-29 MED ORDER — SUMATRIPTAN SUCCINATE 100 MG PO TABS: 100.0000 mg | ORAL_TABLET | Freq: Two times a day (BID) | ORAL | 3 refills | Status: DC | PRN

## 2016-10-29 NOTE — Progress Notes (Signed)
Reason for visit: Migraine headache  Referring physician: Dr. Roetta George is a 31 y.o. female  History of present illness:  Abigail George is a 31 year old right-handed white female with a history of migraine headaches since she was a child. The patient began treatment for migraine when she was 31 years old. The patient has had headaches on average 3 times a week until she became pregnant. During pregnancy she had a reduction in frequency of her headaches, and following delivery she has only had 3 headaches since April 2018. The patient continues to breast-feed. She has not yet returned to work, she will be starting work in January 2019. In the past she has been on atenolol, Topamax, imipramine, and Celexa without much benefit. The patient indicates that stress and weather changes and perfumes may bring on headache. The patient may take Advil if needed or hydrocodone if needed for the headache with good improvement, Imitrex has also worked well. The patient claims that her headaches are all over the head, with pressure sensations in the face and in the neck area. The patient has photophobia and phonophobia and some occasional nausea and vomiting with the headache. She occasionally may miss work. The patient does have some cognitive clouding associated with the migraine. She denies any numbness or weakness of the face, arms, or legs. She may have one to 2 caffeinated products during the day. She is sent to this office for further evaluation.  Past Medical History:  Diagnosis Date  . Anxiety   . Fibrocystic breast changes   . Migraines 12/2007    Past Surgical History:  Procedure Laterality Date  . CESAREAN SECTION N/A 05/08/2016   Procedure: CESAREAN SECTION;  Surgeon: Olivia Mackie, MD;  Location: [facility] BIRTHING SUITES;  Service: Obstetrics;  Laterality: N/A;  . IUD REMOVAL  05/16/14  . TONSILLECTOMY    . TYMPANOSTOMY TUBE PLACEMENT      Family History  Problem Relation Age  of Onset  . Cerebral aneurysm Father     Social history:  reports that she has never smoked. She has never used smokeless tobacco. She reports that she drinks alcohol. She reports that she does not use drugs.  Medications:  Prior to Admission medications   Medication Sig Start Date End Date Taking? Authorizing Provider  Prenatal Vit-Fe Fumarate-FA (PRENATAL MULTIVITAMIN) TABS tablet Take 1 tablet by mouth daily at 12 noon.   Yes [provider]  Probiotic Product (PROBIOTIC PO) Take 1 capsule by mouth daily.   Yes [provider]      Allergies  Allergen Reactions  . Amoxapine And Related Rash  . Amoxicillin Hives and Other (See Comments)    Has patient had a PCN reaction causing immediate rash, facial/tongue/throat swelling, SOB or lightheadedness with hypotension: No Has patient had a PCN reaction causing severe rash involving mucus membranes or skin necrosis: No Has patient had a PCN reaction that required hospitalization No Has patient had a PCN reaction occurring within the last 10 years: Yes If all of the above answers are "NO", then may proceed with Cephalosporin use. Lip swelling    ROS:  Out of a complete 14 system review of symptoms, the patient complains only of the following symptoms, and all other reviewed systems are negative.  Anxiety Headache  Blood pressure 121/74, pulse 68, height  (1.702 m), weight 172 lb (78 kg), unknown if currently breastfeeding.  Physical Exam  General: The patient is alert and cooperative at the time  of the examination.  Eyes: Pupils are equal, round, and reactive to light. Discs are flat bilaterally. Venous pulsations are seen bilaterally.  Neck: The neck is supple, no carotid bruits are noted.  Respiratory: The respiratory examination is clear.  Cardiovascular: The cardiovascular examination reveals a regular rate and rhythm, no obvious murmurs or rubs are noted.  Neuromuscular: Range of movement of the  cervical spine is full. No crepitus is noted in the temperament nuclear joints.  Skin: Extremities are without significant edema.  Neurologic Exam  Mental status: The patient is alert and oriented x 3 at the time of the examination. The patient has apparent normal recent and remote memory, with an apparently normal attention span and concentration ability.  Cranial nerves: Facial symmetry is present. There is good sensation of the face to pinprick and soft touch bilaterally. The strength of the facial muscles and the muscles to head turning and shoulder shrug are normal bilaterally. Speech is well enunciated, no aphasia or dysarthria is noted. Extraocular movements are full. Visual fields are full. The tongue is midline, and the patient has symmetric elevation of the soft palate. No obvious hearing deficits are noted.  Motor: The motor testing reveals 5 over 5 strength of all 4 extremities. Good symmetric motor tone is noted throughout.  Sensory: Sensory testing is intact to pinprick, soft touch, vibration sensation, and position sense on all 4 extremities. No evidence of extinction is noted.  Coordination: Cerebellar testing reveals good finger-nose-finger and heel-to-shin bilaterally.  Gait and station: Gait is normal. Tandem gait is normal. Romberg is negative. No drift is seen.  Reflexes: Deep tendon reflexes are symmetric and normal bilaterally. Toes are downgoing bilaterally.   Assessment/Plan:  1. Common migraine headache  2. Family history of cerebral aneurysm  The patient has had MRA of the head in January 2015 that was completely normal. The patient is having headaches occasionally, the headache frequency has significantly improved following delivery of her child. The patient is breast-feeding, it is reasonable use Imitrex if needed at this time. The patient will follow-up in about 6 months. She will contact our office if needed. A prescription for Imitrex was sent in.  Marlan Palau MD 10/29/2016 1:59 PM  [facility] [address] [place], [place] [address]  Phone [phone] Fax [phone]

## 2016-11-06 DIAGNOSIS — Z0289 Encounter for other administrative examinations: Secondary | ICD-10-CM

## 2016-11-07 DIAGNOSIS — Z0289 Encounter for other administrative examinations: Secondary | ICD-10-CM

## 2017-04-30 ENCOUNTER — Ambulatory Visit: Payer: BC Managed Care – PPO | Admitting: Adult Health

## 2017-06-09 ENCOUNTER — Encounter: Payer: Self-pay | Admitting: Adult Health

## 2017-06-09 ENCOUNTER — Ambulatory Visit: Payer: BC Managed Care – PPO | Admitting: Adult Health

## 2017-06-09 VITALS — BP 123/71 | HR 49 | Ht 67.0 in | Wt 167.4 lb

## 2017-06-09 DIAGNOSIS — Z8249 Family history of ischemic heart disease and other diseases of the circulatory system: Secondary | ICD-10-CM

## 2017-06-09 DIAGNOSIS — G43709 Chronic migraine without aura, not intractable, without status migrainosus: Secondary | ICD-10-CM | POA: Diagnosis not present

## 2017-06-09 MED ORDER — HYDROCODONE-ACETAMINOPHEN 5-325 MG PO TABS: 1.0000 | ORAL_TABLET | ORAL | 0 refills | Status: DC | PRN

## 2017-06-09 NOTE — Progress Notes (Signed)
Reason for visit: Migraine headache  Referring physician: Dr. Jacky Kindle NIYATI HEINKE is a 32 y.o. female  History of present illness:  Update 06/09/17: Patient returns today for six-month follow-up.  She states she has been doing well and since previous appointment, she is unable to say exactly how many migraines she has had due to infrequency.  Patient has prescription for Imitrex but as she is currently breast-feeding she has been taking hydrocodone for relief.  Mother recently passed away and patient has extensive history of cerebral aneurysms on her father's side.  Patient did have MRA of head in 2015 which was negative for aneurysms but due to fear and recent aneurysm diagnosis of immediate family such as her brother, she would like a repeat scan at this time.  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  Patient returns today for follow-up appointment.  Visit 10/29/16 Dr. Anne Hahn:  Ms. Dorwart is a 32 year old right-handed white female with a history of migraine headaches since she was a child. The patient began treatment for migraine when she was 32 years old. The patient has had headaches on average 3 times a week until she became pregnant. During pregnancy she had a reduction in frequency of her headaches, and following delivery she has only had 3 headaches since April 2018. The patient continues to breast-feed. She has not yet returned to work, she will be starting work in January 2019. In the past she has been on atenolol, Topamax, imipramine, and Celexa without much benefit. The patient indicates that stress and weather changes and perfumes may bring on headache. The patient may take Advil if needed or hydrocodone if needed for the headache with good improvement, Imitrex has also worked well. The patient claims that her headaches are all over the head, with pressure sensations in the face and in the neck area. The patient has photophobia and  phonophobia and some occasional nausea and vomiting with the headache. She occasionally may miss work. The patient does have some cognitive clouding associated with the migraine. She denies any numbness or weakness of the face, arms, or legs. She may have one to 2 caffeinated products during the day. She is sent to this office for further evaluation.  Past Medical History:  Diagnosis Date  . Anxiety   . Fibrocystic breast changes   . Migraines 12/2007    Past Surgical History:  Procedure Laterality Date  . CESAREAN SECTION N/A 05/08/2016   Procedure: CESAREAN SECTION;  Surgeon: Olivia Mackie, MD;  Location: [facility] BIRTHING SUITES;  Service: Obstetrics;  Laterality: N/A;  . IUD REMOVAL  05/16/14  . TONSILLECTOMY    . TYMPANOSTOMY TUBE PLACEMENT      Family History  Problem Relation Age of Onset  . Migraines Mother   . Cerebral aneurysm Father   . Cerebral aneurysm Brother     Social history:  reports that she has never smoked. She has never used smokeless tobacco. She reports that she drinks alcohol. She reports that she does not use drugs.  Medications:  Prior to Admission medications   Medication Sig Start Date End Date Taking? Authorizing Provider  Prenatal Vit-Fe Fumarate-FA (PRENATAL MULTIVITAMIN) TABS tablet Take 1 tablet by mouth daily at 12 noon.   Yes [provider]  Probiotic Product (PROBIOTIC PO) Take 1 capsule by mouth daily.   Yes [provider]      Allergies  Allergen Reactions  . Amoxapine And Related Rash  .  Amoxicillin Hives and Other (See Comments)    Has patient had a PCN reaction causing immediate rash, facial/tongue/throat swelling, SOB or lightheadedness with hypotension: No Has patient had a PCN reaction causing severe rash involving mucus membranes or skin necrosis: No Has patient had a PCN reaction that required hospitalization No Has patient had a PCN reaction occurring within the last 10 years: Yes If all of the above answers are  "NO", then may proceed with Cephalosporin use. Lip swelling    ROS:  Out of a complete 14 system review of symptoms, the patient complains only of the following symptoms, and all other reviewed systems are negative.  Headache  Blood pressure 123/71, pulse (!) 49, height  (1.702 m), weight 167 lb 6.4 oz (75.9 kg), unknown if currently breastfeeding.  Physical Exam  General: The patient is alert and cooperative at the time of the examination.  Eyes: Pupils are equal, round, and reactive to light. Discs are flat bilaterally. Venous pulsations are seen bilaterally.  Neck: The neck is supple, no carotid bruits are noted.  Respiratory: The respiratory examination is clear.  Cardiovascular: The cardiovascular examination reveals a regular rate and rhythm, no obvious murmurs or rubs are noted.  Neuromuscular: Range of movement of the cervical spine is full. No crepitus is noted in the temperament nuclear joints.  Skin: Extremities are without significant edema.  Neurologic Exam  Mental status: The patient is alert and oriented x 3 at the time of the examination. The patient has apparent normal recent and remote memory, with an apparently normal attention span and concentration ability.  Cranial nerves: Facial symmetry is present. There is good sensation of the face to pinprick and soft touch bilaterally. The strength of the facial muscles and the muscles to head turning and shoulder shrug are normal bilaterally. Speech is well enunciated, no aphasia or dysarthria is noted. Extraocular movements are full. Visual fields are full. The tongue is midline, and the patient has symmetric elevation of the soft palate. No obvious hearing deficits are noted.  Motor: The motor testing reveals 5 over 5 strength of all 4 extremities. Good symmetric motor tone is noted throughout.  Sensory: Sensory testing is intact to pinprick, soft touch, vibration sensation, and position sense on all 4  extremities. No evidence of extinction is noted.  Coordination: Cerebellar testing reveals good finger-nose-finger and heel-to-shin bilaterally.  Gait and station: Gait is normal. Tandem gait is normal. Romberg is negative. No drift is seen.  Reflexes: Deep tendon reflexes are symmetric and normal bilaterally. Toes are downgoing bilaterally.   Assessment/Plan:  1. Common migraine headache  2. Family history of cerebral aneurysm  Patient's migraine frequency decreased and obtained relief from hydrocodone intermittently as patient is unable to take Imitrex at this time due to breast-feeding.  It was recommended that patient try over-the-counter Advil/Tylenol for infrequent migraines but patient states she has tried this in the past and it did not work. After consulting with Dr. Anne Hahn, it was recommended to prescribe hydrocodone-acetaminophen 5-3 25 mg x 10 tablets for infrequent migraine relief as patient is still breast-feeding at this time.  Recommend to start using Imitrex once completion of breast-feeding.  MRA head ordered to rule out cerebral aneurysm due to extensive family history. Patient will follow-up in 6 months with Aundra Millet, NP.  George Hugh, AGNP-BC  [facility] [address] [place], [place] [address]  Phone [phone] Fax [phone]

## 2017-06-09 NOTE — Patient Instructions (Addendum)
Your Plan:  Continue imitrex  twice daily as needed  Hydrocodone for migraines   Continue to stay active and eat healthy  We will notify you with approval of MRA head  Follow up in 6 months with Aundra Millet, NP     Thank you for coming to see [place] at [facility]. I hope we have been able to provide you high quality care today.  You may receive a patient satisfaction survey over the next few weeks. We would appreciate your feedback and comments so that we may continue to improve ourselves and the health of our patients.

## 2017-06-10 ENCOUNTER — Telehealth: Payer: Self-pay | Admitting: Adult Health

## 2017-06-10 NOTE — Telephone Encounter (Signed)
Patient returned my call she is scheduled for 06/17/17 on the [facility] mobile unit.

## 2017-06-10 NOTE — Telephone Encounter (Signed)
[employer] Auth: [id number] (exp. 06/10/17 to 07/09/17)  lvm for pt to call back about scheduling mra

## 2017-06-17 ENCOUNTER — Ambulatory Visit: Payer: BC Managed Care – PPO

## 2017-06-17 DIAGNOSIS — Z8249 Family history of ischemic heart disease and other diseases of the circulatory system: Secondary | ICD-10-CM | POA: Diagnosis not present

## 2017-06-17 NOTE — Progress Notes (Signed)
I reviewed note and agree with plan.   Suanne Marker, MD 06/17/2017, 2:30 PM Certified in Neurology, Neurophysiology and Neuroimaging  [facility] [address] [place], [place] [address] [phone]

## 2017-06-19 ENCOUNTER — Telehealth: Payer: Self-pay

## 2017-06-19 NOTE — Telephone Encounter (Signed)
-----   Message from George Hugh, NP sent at 06/19/2017 10:35 AM EDT ----- Please notify patient that her MRA looked normal without evidence of aneurysms. Thank you!

## 2017-06-19 NOTE — Telephone Encounter (Signed)
I called and spoke with patient and made her aware of normal MRA results. She voiced understanding and appreciation and did not have any questions at this time.

## 2017-07-14 ENCOUNTER — Ambulatory Visit: Payer: BC Managed Care – PPO | Admitting: Adult Health

## 2017-07-14 ENCOUNTER — Encounter

## 2017-12-30 ENCOUNTER — Telehealth: Payer: Self-pay

## 2017-12-30 ENCOUNTER — Ambulatory Visit: Payer: BC Managed Care – PPO | Admitting: Adult Health

## 2017-12-30 ENCOUNTER — Encounter: Payer: Self-pay | Admitting: Adult Health

## 2017-12-30 VITALS — BP 114/62 | HR 57 | Ht 69.0 in | Wt 170.7 lb

## 2017-12-30 DIAGNOSIS — G43709 Chronic migraine without aura, not intractable, without status migrainosus: Secondary | ICD-10-CM | POA: Diagnosis not present

## 2017-12-30 MED ORDER — HYDROCODONE-ACETAMINOPHEN 5-325 MG PO TABS: 1.0000 | ORAL_TABLET | ORAL | 0 refills | Status: DC | PRN

## 2017-12-30 NOTE — Patient Instructions (Addendum)
Your Plan:  Continue Norco as needed for migraine management - please call office once you have completed breast feeding and we will restart imitrex at that time  Continue to do relaxation techniques for anxiety such as running or meditation    Follow up in 6 months or call earlier if needed       Thank you for coming to see us at [facility]. I hope we have been able to provide you high quality care today.  You may receive a patient satisfaction survey over the next few weeks. We would appreciate your feedback and comments so that we may continue to improve ourselves and the health of our patients.

## 2017-12-30 NOTE — Telephone Encounter (Signed)
Made in error

## 2017-12-30 NOTE — Progress Notes (Signed)
Reason for visit: Migraine headache  Referring physician: Dr. Jacky Kindle Abigail George is a 32 y.o. female  History of present illness:   Interval history 12/30/17: Patient is being seen today for six-month follow-up.  She states overall she has been doing well with approximately 1-2 migraine days per month.  She is very satisfied with her migraine frequency at this time as it has greatly improved since she has been pregnant and breast-feeding.  She does state that she will attempt to take Advil at the first signs of a migraine but at times will have to take Norco if she does not get relief by Advil alone.  She does state that the Norco will help break her migraine.  She does continue breast-feeding and plans on doing so for approximately 4-5 more months.  She does endorse underlying anxiety for which she was taking Xanax for and is aware that anxiety and stress will increase her frequency of migraines.  She does continue to work full-time.  No further concerns at this time.   Update 06/09/17: Patient returns today for six-month follow-up.  She states she has been doing well and since previous appointment, she is unable to say exactly how many migraines she has had due to infrequency.  Patient has prescription for Imitrex but as she is currently breast-feeding she has been taking hydrocodone for relief.  Mother recently passed away and patient has extensive history of cerebral aneurysms on her father's side.  Patient did have MRA of head in 2015 which was negative for aneurysms but due to fear and recent aneurysm diagnosis of immediate family such as her brother, she would like a repeat scan at this time.  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  Patient returns today for follow-up appointment.  Visit 10/29/16 Dr. Anne Hahn:  Abigail George is a 32 year old right-handed white female with a history of migraine headaches since she was a child. The patient began  treatment for migraine when she was 32 years old. The patient has had headaches on average 3 times a week until she became pregnant. During pregnancy she had a reduction in frequency of her headaches, and following delivery she has only had 3 headaches since April 2018. The patient continues to breast-feed. She has not yet returned to work, she will be starting work in January 2019. In the past she has been on atenolol, Topamax, imipramine, and Celexa without much benefit. The patient indicates that stress and weather changes and perfumes may bring on headache. The patient may take Advil if needed or hydrocodone if needed for the headache with good improvement, Imitrex has also worked well. The patient claims that her headaches are all over the head, with pressure sensations in the face and in the neck area. The patient has photophobia and phonophobia and some occasional nausea and vomiting with the headache. She occasionally may miss work. The patient does have some cognitive clouding associated with the migraine. She denies any numbness or weakness of the face, arms, or legs. She may have one to 2 caffeinated products during the day. She is sent to this office for further evaluation.    Past Medical History:  Diagnosis Date  . Anxiety   . Fibrocystic breast changes   . Migraines 12/2007    Past Surgical History:  Procedure Laterality Date  . CESAREAN SECTION N/A 05/08/2016   Procedure: CESAREAN SECTION;  Surgeon: Olivia Mackie, MD;  Location: [facility] BIRTHING SUITES;  Service: Obstetrics;  Laterality: N/A;  . IUD REMOVAL  05/16/14  . TONSILLECTOMY    . TYMPANOSTOMY TUBE PLACEMENT      Family History  Problem Relation Age of Onset  . Migraines Mother   . Cerebral aneurysm Father   . Cerebral aneurysm Brother     Social history:  reports that she has never smoked. She has never used smokeless tobacco. She reports that she drinks alcohol. She reports that she does not use drugs.  Medications:    Prior to Admission medications   Medication Sig Start Date End Date Taking? Authorizing Provider  Prenatal Vit-Fe Fumarate-FA (PRENATAL MULTIVITAMIN) TABS tablet Take 1 tablet by mouth daily at 12 noon.   Yes [provider]  Probiotic Product (PROBIOTIC PO) Take 1 capsule by mouth daily.   Yes [provider]      Allergies  Allergen Reactions  . Amoxapine And Related Rash  . Amoxicillin Hives and Other (See Comments)    Has patient had a PCN reaction causing immediate rash, facial/tongue/throat swelling, SOB or lightheadedness with hypotension: No Has patient had a PCN reaction causing severe rash involving mucus membranes or skin necrosis: No Has patient had a PCN reaction that required hospitalization No Has patient had a PCN reaction occurring within the last 10 years: Yes If all of the above answers are "NO", then may proceed with Cephalosporin use. Lip swelling    ROS:  Out of a complete 14 system review of symptoms, the patient complains only of the following symptoms, and all other reviewed systems are negative.  Headache  Blood pressure 114/62, pulse (!) 57, height 5\' 9"  (1.753 m), weight 170 lb 11.2 oz (77.4 kg), unknown if currently breastfeeding.  Physical Exam  General: The patient is alert and cooperative at the time of the examination.  Eyes: Pupils are equal, round, and reactive to light. Discs are flat bilaterally. Venous pulsations are seen bilaterally.  Neck: The neck is supple, no carotid bruits are noted.  Respiratory: The respiratory examination is clear.  Cardiovascular: The cardiovascular examination reveals a regular rate and rhythm, no obvious murmurs or rubs are noted.  Neuromuscular: Range of movement of the cervical spine is full. No crepitus is noted in the temperament nuclear joints.  Skin: Extremities are without significant edema.  Neurologic Exam  Mental status: The patient is alert and oriented x 3 at the time of the  examination. The patient has apparent normal recent and remote memory, with an apparently normal attention span and concentration ability.  Cranial nerves: Facial symmetry is present. There is good sensation of the face to pinprick and soft touch bilaterally. The strength of the facial muscles and the muscles to head turning and shoulder shrug are normal bilaterally. Speech is well enunciated, no aphasia or dysarthria is noted. Extraocular movements are full. Visual fields are full. The tongue is midline, and the patient has symmetric elevation of the soft palate. No obvious hearing deficits are noted.  Motor: The motor testing reveals 5 over 5 strength of all 4 extremities. Good symmetric motor tone is noted throughout.  Sensory: Sensory testing is intact to pinprick, soft touch, vibration sensation, and position sense on all 4 extremities. No evidence of extinction is noted.  Coordination: Cerebellar testing reveals good finger-nose-finger and heel-to-shin bilaterally.  Gait and station: Gait is normal. Tandem gait is normal. Romberg is negative. No drift is seen.  Reflexes: Deep tendon reflexes are symmetric and normal bilaterally. Toes are downgoing bilaterally.   Assessment/Plan:  1. Common migraine headache  As patient's migraines have been well controlled and breakthrough relief with hydrocodone-acetaminophen 5-325 mg, it is recommended to continue this at this time.  Discussion with patient regarding returning to Imitrex once breast-feeding is complete.  She is also questioning whether she can return to taking Xanax as needed as she was using this prior to her pregnancy to prevent migraines as they would frequently occur during increased stress or high anxiety situations.  Advised patient to follow-up in 6 months time or call earlier if she completes breast-feeding prior to the scheduled time.   George Hugh, AGNP-BC  [facility] [address] [place], [place] [address]  Phone [phone] Fax [phone]

## 2017-12-30 NOTE — Progress Notes (Signed)
I have read the note, and I agree with the clinical assessment and plan.  Abigail George   

## 2017-12-31 ENCOUNTER — Telehealth: Payer: Self-pay

## 2017-12-31 MED ORDER — HYDROCODONE-ACETAMINOPHEN 5-325 MG PO TABS: 1.0000 | ORAL_TABLET | ORAL | 0 refills | Status: DC | PRN

## 2017-12-31 NOTE — Telephone Encounter (Signed)
Jessica NP unable to send norco escribed. Tried multiple times to send via computer. Medication will be left at front desk for pt to pick up. PT will be call.

## 2017-12-31 NOTE — Addendum Note (Signed)
Addended by: George HughVANSCHAICK, Trixy Loyola on: 12/31/2017 07:44 AM   Modules accepted: Orders

## 2017-12-31 NOTE — Telephone Encounter (Signed)
Left vm for patient that norco is at the front desk for pick up.Left vm that Shanda BumpsJessica NP is unable to escribed via computer at this time. RN reminded pt on vm to have ID for pick up.

## 2017-12-31 NOTE — Addendum Note (Signed)
Addended by: George HughVANSCHAICK, Erianna Jolly on: 12/31/2017 03:25 PM   Modules accepted: Orders

## 2018-04-26 ENCOUNTER — Telehealth: Payer: Self-pay | Admitting: Adult Health

## 2018-04-26 MED ORDER — HYDROCODONE-ACETAMINOPHEN 5-325 MG PO TABS: 1.0000 | ORAL_TABLET | ORAL | 0 refills | Status: DC | PRN

## 2018-04-26 NOTE — Telephone Encounter (Signed)
Referral placed for 10 tablets of hydrocodone-acetaminophen 5-325 mg tablet as needed for migraine.

## 2018-04-26 NOTE — Telephone Encounter (Signed)
Pt called in for a refill of HYDROcodone-acetaminophen (NORCO/VICODIN) 5-325 MG tablet to be sent to [id number] - [place], [place] - [address] AT [facility] OF [address] & [address]

## 2018-04-26 NOTE — Addendum Note (Signed)
Addended by: George Hugh on: 04/26/2018 02:06 PM   Modules accepted: Orders

## 2018-06-29 ENCOUNTER — Telehealth: Payer: Self-pay

## 2018-06-29 NOTE — Telephone Encounter (Signed)
Unable to get in contact with the patient to convert their office visit with Abigail George on 07/01/2018 into a doxy.me visit. I left a voicemail asking the patient to return my call. Office number was provided.   If patient calls back please convert their office visit into a doxy.me visit.

## 2018-06-30 ENCOUNTER — Telehealth: Payer: Self-pay

## 2018-06-30 NOTE — Telephone Encounter (Signed)
LEft vm for patient that her appt will be cancel due to unable to reach. The appt will be video due to COVID 19. I stated to reschedule with Judson Roch NP.

## 2018-07-01 ENCOUNTER — Ambulatory Visit: Payer: BC Managed Care – PPO | Admitting: Adult Health

## 2018-08-17 ENCOUNTER — Telehealth: Payer: Self-pay | Admitting: Adult Health

## 2018-08-17 NOTE — Telephone Encounter (Signed)
Patient wants a virtual apt. With [employer]

## 2018-08-18 NOTE — Telephone Encounter (Addendum)
I called pt and made appt with her (mychart VV) on 08-30-18 with JV/NP who has seen her previously.   Email sent.

## 2018-08-30 ENCOUNTER — Encounter: Payer: Self-pay | Admitting: Adult Health

## 2018-08-30 ENCOUNTER — Telehealth (INDEPENDENT_AMBULATORY_CARE_PROVIDER_SITE_OTHER): Payer: BC Managed Care – PPO | Admitting: Adult Health

## 2018-08-30 DIAGNOSIS — G43709 Chronic migraine without aura, not intractable, without status migrainosus: Secondary | ICD-10-CM | POA: Diagnosis not present

## 2018-08-30 DIAGNOSIS — F419 Anxiety disorder, unspecified: Secondary | ICD-10-CM

## 2018-08-30 MED ORDER — HYDROCODONE-ACETAMINOPHEN 5-325 MG PO TABS: 1.0000 | ORAL_TABLET | ORAL | 0 refills | Status: DC | PRN

## 2018-08-30 MED ORDER — ALPRAZOLAM 0.25 MG PO TABS: 0.2500 mg | ORAL_TABLET | Freq: Every evening | ORAL | 0 refills | Status: DC | PRN

## 2018-08-30 MED ORDER — SUMATRIPTAN SUCCINATE 100 MG PO TABS: 100.0000 mg | ORAL_TABLET | Freq: Two times a day (BID) | ORAL | 3 refills | Status: DC | PRN

## 2018-08-30 NOTE — Progress Notes (Signed)
Reason for visit: Migraine headache  [facility] provider: Dr. Anne HahnWillis  Virtual Visit via Video Note  I connected with Abigail SermonsElizabeth S George on 08/30/18 at  8:15 AM EDT by a video enabled telemedicine application via MyChart with provider [facility] at [facility] and verified that I am speaking with the correct person using two identifiers who was located at their own home.   Abigail FiscalSandra Young, RN schedule a virtual visit who discussed the limitations of evaluation and management by telemedicine and the availability of in person appointments. The patient expressed understanding and agreed to proceed.   History of present illness:  Update 08/30/2018: Abigail George is a 33 year old female who is being seen today for chronic migraine follow-up and medication management.  She is being seen through telemedicine via MyChart with patient's consent.  At prior visit, migraine stable with use of hydrocodone-acetaminophen as needed due to pregnancy and then continuation of breast-feeding.  She stopped breast-feeding around April and has since been experiencing increase in migraines.  Migraine frequency approximately 2-3 times per month.  She will use hydrocodone-acetaminophen for debilitating headaches with benefit.  She also endorses increased stress around June with moving and experiencing migraines daily over that time.  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  Previously on Xanax 0.25 mg as needed with benefit as increased stress can typically increase migraine frequency.  Also previously on Imitrex for emergent migraine relief with benefit but discontinued with pregnancy and breast-feeding.  She was also previously on methylphenidate for diagnosis of ADHD but discontinued with pregnancy.  She is questioning restarting of this medication to help with organization and overall concentration as this is also been leading to  increased stress with the upcoming school year.  No further concerns at this time.     Update 12/30/17: Patient is being seen today for six-month follow-up.  She states overall she has been doing well with approximately 1-2 migraine days per month.  She is very satisfied with her migraine frequency at this time as it has greatly improved since she has been pregnant and breast-feeding.  She does state that she will attempt to take Advil at the first signs of a migraine but at times will have to take Norco if she does not get relief by Advil alone.  She does state that the Norco will help break her migraine.  She does continue breast-feeding and plans on doing so for approximately 4-5 more months.  She does endorse underlying anxiety for which she was taking Xanax for and is aware that anxiety and stress will increase her frequency of migraines.  She does continue to work full-time.  No further concerns at this time.   Update 06/09/17: Patient returns today for six-month follow-up.  She states she has been doing well and since previous appointment, she is unable to say exactly how many migraines she has had due to infrequency.  Patient has prescription for Imitrex but as she is currently breast-feeding she has been taking hydrocodone for relief.  Mother recently passed away and patient has extensive history of cerebral aneurysms on her father's side.  Patient did have MRA of head in 2015 which was negative for aneurysms but due to fear and recent aneurysm diagnosis of immediate family such as her brother, she would like a repeat scan at this time.  Patient is continuing to work at [employer] which is a [employer] school.  Patient returns today for follow-up appointment.  Visit 10/29/16 Dr. Jannifer Franklin:  Abigail George is a 33 year old right-handed white female with a history of migraine headaches since she was a child. The patient began treatment for migraine when she was 33 years old. The patient has had  headaches on average 3 times a week until she became pregnant. During pregnancy she had a reduction in frequency of her headaches, and following delivery she has only had 3 headaches since April 2018. The patient continues to breast-feed. She has not yet returned to work, she will be starting work in January 2019. In the past she has been on atenolol, Topamax, imipramine, and Celexa without much benefit. The patient indicates that stress and weather changes and perfumes may bring on headache. The patient may take Advil if needed or hydrocodone if needed for the headache with good improvement, Imitrex has also worked well. The patient claims that her headaches are all over the head, with pressure sensations in the face and in the neck area. The patient has photophobia and phonophobia and some occasional nausea and vomiting with the headache. She occasionally may miss work. The patient does have some cognitive clouding associated with the migraine. She denies any numbness or weakness of the face, arms, or legs. She may have one to 2 caffeinated products during the day. She is sent to this office for further evaluation.    Past Medical History:  Diagnosis Date   Anxiety    Fibrocystic breast changes    Migraines 12/2007    Past Surgical History:  Procedure Laterality Date   CESAREAN SECTION N/A 05/08/2016   Procedure: CESAREAN SECTION;  Surgeon: Brien Few, MD;  Location: [place];  Service: Obstetrics;  Laterality: N/A;   IUD REMOVAL  05/16/14   TONSILLECTOMY     TYMPANOSTOMY TUBE PLACEMENT      Family History  Problem Relation Age of Onset   Migraines Mother    Cerebral aneurysm Father    Cerebral aneurysm Brother     Social history:  reports that she has never smoked. She has never used smokeless tobacco. She reports current alcohol use. She reports that she does not use drugs.  Medications:  Prior to Admission medications   Medication Sig Start Date End Date  Taking? Authorizing Provider  Prenatal Vit-Fe Fumarate-FA (PRENATAL MULTIVITAMIN) TABS tablet Take 1 tablet by mouth daily at 12 noon.   Yes [provider]  Probiotic Product (PROBIOTIC PO) Take 1 capsule by mouth daily.   Yes [provider]      Allergies  Allergen Reactions   Amoxapine And Related Rash   Amoxicillin Hives and Other (See Comments)    Has patient had a PCN reaction causing immediate rash, facial/tongue/throat swelling, SOB or lightheadedness with hypotension: No Has patient had a PCN reaction causing severe rash involving mucus membranes or skin necrosis: No Has patient had a PCN reaction that required hospitalization No Has patient had a PCN reaction occurring within the last 10 years: Yes If all of the above answers are "NO", then may proceed with Cephalosporin use. Lip swelling    ROS: Review of Systems  Constitutional: Negative.   HENT: Negative.   Eyes: Negative.   Respiratory: Negative.   Cardiovascular: Negative.   Gastrointestinal: Negative.   Genitourinary: Negative.   Musculoskeletal: Negative.   Skin: Negative.   Neurological: Positive for headaches.  Endo/Heme/Allergies: Negative.   Psychiatric/Behavioral:       Anxiety     Physical Exam *Limited  exam due to visit type*  General: well developed, well nourished, pleasant middle-age Caucasian female, seated, in no evident distress Head: head normocephalic and atraumatic.    Neurologic Exam Mental Status: Awake and fully alert. Oriented to place and time. Recent and remote memory intact. Attention span, concentration and fund of knowledge appropriate. Mood and affect appropriate.  Cranial Nerves: Extraocular movements full without nystagmus. Hearing intact to voice. Facial sensation intact. Face, tongue, palate moves normally and symmetrically.  Shoulder shrug symmetric. Motor: No evidence of weakness per drift assessment Sensory.: intact to light touch Coordination: Rapid  alternating movements normal in all extremities. Finger-to-nose and heel-to-shin performed accurately bilaterally. Gait and Station: Arises from chair without difficulty. Stance is normal. Gait demonstrates normal stride length and balance .  Reflexes: UTA   Assessment/Plan:  1. Common migraine headache  Increase in migraine headaches since prior visit since discontinuing breast-feeding in April.  Continues to use hydrocodone-acetaminophen 5-325 mg as needed with benefit.  Prior refill 04/2018 with only 10 tablets provided -new refill provided.  She does need additional refill at this time.  Reinitiated Imitrex as needed for emergent migraine relief (previously stopped due to pregnancy/breast-feeding).  Also reinitiated Xanax 0.25 mg as needed due to increased migraines with increased anxiety (previously stopped due to pregnancy/breast-feeding).  She is questioning restarting methylphenidate which she was previously on for ADHD and has found increased difficulty with concentrating/organization has increased overall anxiety.  Advised her that this will be discussed Dr. Anne HahnWillis for appropriateness of neurological prescribing or to defer to PCP (previously prescribed by PCP but has since changed providers)  She will follow-up in 6 months with Maralyn SagoSarah, NP or call earlier if needed  Greater than 50% of this 15-minute non-face-to-face visit was spent discussing ongoing migraine management with recent increasing migraine frequency since stopping breast-feeding and increased stress.  All questions answered to patient satisfaction.   George HughJessica Meilech Virts, AGNP-BC  [facility] [address] [place], [place] [id number]  Phone [phone] Fax [phone]

## 2018-08-30 NOTE — Progress Notes (Signed)
I have read the note, and I agree with the clinical assessment and plan.  Abigail George   

## 2018-11-16 ENCOUNTER — Other Ambulatory Visit: Payer: Self-pay | Admitting: Adult Health

## 2018-11-16 NOTE — Telephone Encounter (Signed)
Pt is needing a refill on her HYDROcodone-acetaminophen (NORCO/VICODIN) 5-325 MG tablet sent to the [facility] on [facility]

## 2018-11-17 MED ORDER — HYDROCODONE-ACETAMINOPHEN 5-325 MG PO TABS: 1.0000 | ORAL_TABLET | ORAL | 0 refills | Status: DC | PRN

## 2018-11-17 NOTE — Telephone Encounter (Signed)
Drug registry checked last fill 08-30-18 # 10.

## 2019-01-17 ENCOUNTER — Ambulatory Visit: Payer: BC Managed Care – PPO | Attending: Internal Medicine

## 2019-01-17 DIAGNOSIS — Z20822 Contact with and (suspected) exposure to covid-19: Secondary | ICD-10-CM

## 2019-01-19 LAB — NOVEL CORONAVIRUS, NAA: SARS-CoV-2, NAA: NOT DETECTED

## 2019-01-20 ENCOUNTER — Other Ambulatory Visit: Payer: Self-pay

## 2019-01-20 DIAGNOSIS — Z20822 Contact with and (suspected) exposure to covid-19: Secondary | ICD-10-CM

## 2019-01-21 LAB — NOVEL CORONAVIRUS, NAA: SARS-CoV-2, NAA: DETECTED — AB

## 2019-01-22 ENCOUNTER — Telehealth (HOSPITAL_COMMUNITY): Payer: Self-pay | Admitting: Critical Care Medicine

## 2019-01-22 NOTE — Telephone Encounter (Signed)
Connected with this patient is positive for Covid from December 31 testing event.  She has been symptomatic since 26 December.  I indicated her she will need to stay in isolation at least until 6 of January.  She only has loss of taste and smell at this time and is not a monoclonal antibody candidate

## 2019-02-17 ENCOUNTER — Other Ambulatory Visit: Payer: Self-pay | Admitting: Adult Health

## 2019-02-17 MED ORDER — HYDROCODONE-ACETAMINOPHEN 5-325 MG PO TABS: 1.0000 | ORAL_TABLET | ORAL | 0 refills | Status: DC | PRN

## 2019-02-17 NOTE — Telephone Encounter (Signed)
Drug registry checked last fill 11-17-18 #10.

## 2019-02-17 NOTE — Addendum Note (Signed)
Addended by: Guy Begin on: 02/17/2019 10:09 AM   Modules accepted: Orders

## 2019-02-17 NOTE — Telephone Encounter (Signed)
Pt has called for a refill on her HYDROcodone-acetaminophen (NORCO/VICODIN) 5-325 MG tablet [facility] [phone]

## 2019-03-09 ENCOUNTER — Ambulatory Visit: Payer: BC Managed Care – PPO | Admitting: Neurology

## 2019-03-17 ENCOUNTER — Other Ambulatory Visit: Payer: Self-pay

## 2019-03-17 ENCOUNTER — Ambulatory Visit: Payer: BC Managed Care – PPO | Attending: Internal Medicine

## 2019-03-17 DIAGNOSIS — Z23 Encounter for immunization: Secondary | ICD-10-CM

## 2019-03-17 NOTE — Progress Notes (Signed)
   Covid-19 Vaccination Clinic  Name:  ESSICA KIKER    MRN: [id number] DOB: [birth year]  03/17/2019  Ms. Hinely was observed post Covid-19 immunization for 15 minutes without incidence. She was provided with Vaccine Information Sheet and instruction to access the V-Safe system.   Ms. Masullo was instructed to call 911 with any severe reactions post vaccine: Marland Kitchen Difficulty breathing  . Swelling of your face and throat  . A fast heartbeat  . A bad rash all over your body  . Dizziness and weakness    Immunizations Administered    Name Date Dose VIS Date Route   Moderna COVID-19 Vaccine 03/17/2019  3:28 PM 0.5 mL 12/21/2018 Intramuscular   Manufacturer: Moderna   Lot: [id number]   NDC: 21031-281-18

## 2019-04-19 ENCOUNTER — Ambulatory Visit: Payer: BC Managed Care – PPO | Attending: Internal Medicine

## 2019-04-19 DIAGNOSIS — Z23 Encounter for immunization: Secondary | ICD-10-CM

## 2019-04-19 NOTE — Progress Notes (Signed)
   Covid-19 Vaccination Clinic  Name:  OFFIE PICKRON    MRN: [id number] DOB: Jan 02, 1986  04/19/2019  Ms. Barrell was observed post Covid-19 immunization for 15 minutes without incident. She was provided with Vaccine Information Sheet and instruction to access the V-Safe system.   Ms. Sanpedro was instructed to call 911 with any severe reactions post vaccine: Marland Kitchen Difficulty breathing  . Swelling of face and throat  . A fast heartbeat  . A bad rash all over body  . Dizziness and weakness   Immunizations Administered    Name Date Dose VIS Date Route   Moderna COVID-19 Vaccine 04/19/2019  3:33 PM 0.5 mL 12/21/2018 Intramuscular   Manufacturer: Moderna   Lot: [id number]   NDC: 67672-094-70

## 2019-06-02 LAB — OB RESULTS CONSOLE GC/CHLAMYDIA
Chlamydia: NEGATIVE
Gonorrhea: NEGATIVE

## 2019-06-02 LAB — OB RESULTS CONSOLE HIV ANTIBODY (ROUTINE TESTING): HIV: NONREACTIVE

## 2019-06-02 LAB — OB RESULTS CONSOLE RUBELLA ANTIBODY, IGM: Rubella: IMMUNE

## 2019-06-02 LAB — OB RESULTS CONSOLE RPR: RPR: NONREACTIVE

## 2019-06-02 LAB — OB RESULTS CONSOLE HEPATITIS B SURFACE ANTIGEN: Hepatitis B Surface Ag: NEGATIVE

## 2019-06-26 ENCOUNTER — Inpatient Hospital Stay (HOSPITAL_COMMUNITY)
Admission: AD | Admit: 2019-06-26 | Discharge: 2019-06-26 | Disposition: A | Discharge status: Home / Self Care | Payer: BC Managed Care – PPO | Attending: Obstetrics and Gynecology | Admitting: Obstetrics and Gynecology

## 2019-06-26 ENCOUNTER — Other Ambulatory Visit: Payer: Self-pay

## 2019-06-26 ENCOUNTER — Encounter (HOSPITAL_COMMUNITY): Payer: Self-pay | Admitting: Obstetrics and Gynecology

## 2019-06-26 DIAGNOSIS — G43709 Chronic migraine without aura, not intractable, without status migrainosus: Secondary | ICD-10-CM

## 2019-06-26 DIAGNOSIS — O99351 Diseases of the nervous system complicating pregnancy, first trimester: Secondary | ICD-10-CM | POA: Diagnosis present

## 2019-06-26 DIAGNOSIS — Z885 Allergy status to narcotic agent status: Secondary | ICD-10-CM | POA: Insufficient documentation

## 2019-06-26 DIAGNOSIS — Z79899 Other long term (current) drug therapy: Secondary | ICD-10-CM | POA: Insufficient documentation

## 2019-06-26 DIAGNOSIS — O26891 Other specified pregnancy related conditions, first trimester: Secondary | ICD-10-CM | POA: Diagnosis not present

## 2019-06-26 DIAGNOSIS — Z3A12 12 weeks gestation of pregnancy: Secondary | ICD-10-CM | POA: Diagnosis not present

## 2019-06-26 DIAGNOSIS — F419 Anxiety disorder, unspecified: Secondary | ICD-10-CM | POA: Diagnosis not present

## 2019-06-26 DIAGNOSIS — Z880 Allergy status to penicillin: Secondary | ICD-10-CM | POA: Diagnosis not present

## 2019-06-26 DIAGNOSIS — O99341 Other mental disorders complicating pregnancy, first trimester: Secondary | ICD-10-CM | POA: Insufficient documentation

## 2019-06-26 LAB — URINALYSIS, ROUTINE W REFLEX MICROSCOPIC
Bilirubin Urine: NEGATIVE
Glucose, UA: NEGATIVE mg/dL
Hgb urine dipstick: NEGATIVE
Ketones, ur: NEGATIVE mg/dL
Leukocytes,Ua: NEGATIVE
Nitrite: NEGATIVE
Protein, ur: NEGATIVE mg/dL
Specific Gravity, Urine: 1.012 (ref 1.005–1.030)
pH: 7 (ref 5.0–8.0)

## 2019-06-26 LAB — POCT PREGNANCY, URINE: Preg Test, Ur: POSITIVE — AB

## 2019-06-26 MED ORDER — DEXAMETHASONE SODIUM PHOSPHATE 10 MG/ML IJ SOLN
10.0000 mg | Freq: Once | INTRAMUSCULAR | Status: AC
  Administered 2019-06-26: 10 mg via INTRAVENOUS
  Filled 2019-06-26: qty 1

## 2019-06-26 MED ORDER — LACTATED RINGERS IV BOLUS
1000.0000 mL | Freq: Once | INTRAVENOUS | Status: AC
  Administered 2019-06-26: 1000 mL via INTRAVENOUS

## 2019-06-26 MED ORDER — DIPHENHYDRAMINE HCL 50 MG/ML IJ SOLN
25.0000 mg | Freq: Once | INTRAMUSCULAR | Status: AC
  Administered 2019-06-26: 25 mg via INTRAVENOUS
  Filled 2019-06-26: qty 1

## 2019-06-26 MED ORDER — HYDROMORPHONE HCL 1 MG/ML IJ SOLN
1.0000 mg | Freq: Once | INTRAMUSCULAR | Status: AC
  Administered 2019-06-26: 1 mg via INTRAVENOUS
  Filled 2019-06-26: qty 1

## 2019-06-26 MED ORDER — METOCLOPRAMIDE HCL 5 MG/ML IJ SOLN
10.0000 mg | Freq: Once | INTRAMUSCULAR | Status: AC
  Administered 2019-06-26: 10 mg via INTRAVENOUS
  Filled 2019-06-26: qty 2

## 2019-06-26 MED ORDER — METOCLOPRAMIDE HCL 10 MG PO TABS: 10.0000 mg | ORAL_TABLET | Freq: Four times a day (QID) | ORAL | 0 refills | Status: DC

## 2019-06-26 NOTE — MAU Provider Note (Signed)
History     CSN: [id number]  Arrival date and time: 06/26/19 0931   First Provider Initiated Contact with Patient 06/26/19 1122      Chief Complaint  Patient presents with  . Headache   Abigail George is a 34 y.o. G3P1011 at [redacted]w[redacted]d who presents today with a migraine headache. Patient reports a hx of migraines and she typically uses ibuprofen, tylenol and/or hydrocodone. She states that this headache started around 0300 on 06/25/2019. Since that time she has taken 4 Vicodin tabs, and has not had any relief.   Headache  This is a new problem. The current episode started yesterday. The problem occurs constantly. The problem has been gradually worsening. The pain is located in the frontal region. The pain does not radiate. The pain quality is similar to prior headaches. The pain is at a severity of 10/10. Associated symptoms include nausea and vomiting. Pertinent negatives include no fever. The symptoms are aggravated by bright light. She has tried acetaminophen, darkened room and oral narcotics for the symptoms. The treatment provided no relief.    OB History    Gravida  3   Para  1   Term  1   Preterm  0   AB  1   Living  1     SAB  1   TAB  0   Ectopic  0   Multiple  0   Live Births  1           Past Medical History:  Diagnosis Date  . Anxiety   . Fibrocystic breast changes   . Migraines 12/2007    Past Surgical History:  Procedure Laterality Date  . CESAREAN SECTION N/A 05/08/2016   Procedure: CESAREAN SECTION;  Surgeon: Olivia Mackie, MD;  Location: [facility];  Service: Obstetrics;  Laterality: N/A;  . IUD REMOVAL  05/16/14  . TONSILLECTOMY    . TYMPANOSTOMY TUBE PLACEMENT      Family History  Problem Relation Age of Onset  . Migraines Mother   . Cerebral aneurysm Father   . Cerebral aneurysm Brother     Social History   Tobacco Use  . Smoking status: Never Smoker  . Smokeless tobacco: Never Used  Substance Use Topics  . Alcohol  use: Yes    Alcohol/week: 0.0 standard drinks    Comment: less than 1 a week  . Drug use: No    Allergies:  Allergies  Allergen Reactions  . Amoxapine And Related Rash  . Amoxicillin Hives and Other (See Comments)    Has patient had a PCN reaction causing immediate rash, facial/tongue/throat swelling, SOB or lightheadedness with hypotension: No Has patient had a PCN reaction causing severe rash involving mucus membranes or skin necrosis: No Has patient had a PCN reaction that required hospitalization No Has patient had a PCN reaction occurring within the last 10 years: Yes If all of the above answers are "NO", then may proceed with Cephalosporin use. Lip swelling  . Percocet [Oxycodone-Acetaminophen] Rash    Medications Prior to Admission  Medication Sig Dispense Refill Last Dose  . ALPRAZolam (XANAX) 0.25 MG tablet Take 1 tablet (0.25 mg total) by mouth at bedtime as needed for anxiety. 30 tablet 0   . HYDROcodone-acetaminophen (NORCO/VICODIN) 5-325 MG tablet Take 1 tablet by mouth every 4 (four) hours as needed for up to 10 doses for moderate pain (migraine). 10 tablet 0   . Prenatal Vit-Fe Fumarate-FA (PRENATAL MULTIVITAMIN) TABS tablet Take 1 tablet by mouth  daily at 12 noon.     . Probiotic Product (PROBIOTIC PO) Take 1 capsule by mouth daily.     . SUMAtriptan (IMITREX) 100 MG tablet Take 1 tablet (100 mg total) by mouth 2 (two) times daily as needed for migraine. May repeat x1 as needed.  Do not exceed 200 mg in 24 hours 10 tablet 3     Review of Systems  Constitutional: Negative for chills and fever.  Gastrointestinal: Positive for nausea and vomiting.  Neurological: Positive for headaches.   Physical Exam   Blood pressure 129/68, pulse 65, temperature 97.7 F (36.5 C), temperature source Oral, resp. rate 16, SpO2 100 %, unknown if currently breastfeeding.  Physical Exam  Nursing note and vitals reviewed. Constitutional: She is oriented to person, place, and time. She  appears well-developed and well-nourished. No distress.  HENT:  Head: Normocephalic.  Cardiovascular: Normal rate.  Respiratory: Effort normal.  Neurological: She is alert and oriented to person, place, and time.  Skin: Skin is warm and dry.  Psychiatric: She has a normal mood and affect.   +FHT 156 with doppler   Results for orders placed or performed during the hospital encounter of 06/26/19 (from the past 24 hour(s))  Urinalysis, Routine w reflex microscopic     Status: Abnormal   Collection Time: 06/26/19 10:05 AM  Result Value Ref Range   Color, Urine YELLOW YELLOW   APPearance CLOUDY (A) CLEAR   Specific Gravity, Urine 1.012 1.005 - 1.030   pH 7.0 5.0 - 8.0   Glucose, UA NEGATIVE NEGATIVE mg/dL   Hgb urine dipstick NEGATIVE NEGATIVE   Bilirubin Urine NEGATIVE NEGATIVE   Ketones, ur NEGATIVE NEGATIVE mg/dL   Protein, ur NEGATIVE NEGATIVE mg/dL   Nitrite NEGATIVE NEGATIVE   Leukocytes,Ua NEGATIVE NEGATIVE  Pregnancy, urine POC     Status: Abnormal   Collection Time: 06/26/19 10:07 AM  Result Value Ref Range   Preg Test, Ur POSITIVE (A) NEGATIVE    MAU Course  Procedures  MDM  Patient has had migraine cocktail (decadron, benadryl and reglan). She reports some improvement with her headache, but it is still present. Given 1mg  of dilaudid. Patient reports that pain is now 0/10.   Assessment and Plan   1. Chronic migraine without aura without status migrainosus, not intractable   2. [redacted] weeks gestation of pregnancy    DC home Comfort measures reviewed  2nd Trimester precautions  RX: reglan 10mg  PRN #30  Return to MAU as needed FU with OB as planned  Follow-up Information    [facility], [facility] Follow up.   Contact information: [place] [address] [place] DNP, CNM  06/26/19  1:58 PM

## 2019-06-26 NOTE — MAU Note (Signed)
Abigail George is a 34 y.o. at [redacted]w[redacted]d here in MAU reporting: migraine since yesterday at 0300. Has tried 4 hydrocodone with no relief. Having some nausea and vomiting, emesis x 2. No abdominal pain, bleeding, or discharge.  Onset of complaint: yesterday  Pain score: 10/10  Vitals:   06/26/19 1024  BP: 129/68  Pulse: 65  Resp: 16  Temp: 97.7 F (36.5 C)  SpO2: 100%     FHT: 156  Lab orders placed from triage: UPT, UA

## 2019-06-26 NOTE — Discharge Instructions (Signed)

## 2019-07-27 ENCOUNTER — Encounter: Payer: Self-pay | Admitting: Gastroenterology

## 2019-08-24 ENCOUNTER — Ambulatory Visit: Payer: BC Managed Care – PPO | Admitting: Gastroenterology

## 2019-08-25 ENCOUNTER — Encounter: Payer: Self-pay | Admitting: Gastroenterology

## 2019-08-25 ENCOUNTER — Ambulatory Visit: Payer: BC Managed Care – PPO | Admitting: Gastroenterology

## 2019-08-25 ENCOUNTER — Other Ambulatory Visit (INDEPENDENT_AMBULATORY_CARE_PROVIDER_SITE_OTHER): Payer: BC Managed Care – PPO

## 2019-08-25 VITALS — BP 100/64 | HR 64 | Ht 69.0 in | Wt 177.4 lb

## 2019-08-25 DIAGNOSIS — R197 Diarrhea, unspecified: Secondary | ICD-10-CM

## 2019-08-25 DIAGNOSIS — K219 Gastro-esophageal reflux disease without esophagitis: Secondary | ICD-10-CM

## 2019-08-25 DIAGNOSIS — O26899 Other specified pregnancy related conditions, unspecified trimester: Secondary | ICD-10-CM

## 2019-08-25 LAB — CBC WITH DIFFERENTIAL/PLATELET
Basophils Absolute: 0 10*3/uL (ref 0.0–0.1)
Basophils Relative: 0.3 % (ref 0.0–3.0)
Eosinophils Absolute: 0 10*3/uL (ref 0.0–0.7)
Eosinophils Relative: 0.5 % (ref 0.0–5.0)
HCT: 37.7 % (ref 36.0–46.0)
Hemoglobin: 13.6 g/dL (ref 12.0–15.0)
Lymphocytes Relative: 16.2 % (ref 12.0–46.0)
Lymphs Abs: 1.4 10*3/uL (ref 0.7–4.0)
MCHC: 36.1 g/dL — ABNORMAL HIGH (ref 30.0–36.0)
MCV: 92 fl (ref 78.0–100.0)
Monocytes Absolute: 0.6 10*3/uL (ref 0.1–1.0)
Monocytes Relative: 7.1 % (ref 3.0–12.0)
Neutro Abs: 6.4 10*3/uL (ref 1.4–7.7)
Neutrophils Relative %: 75.9 % (ref 43.0–77.0)
Platelets: 226 10*3/uL (ref 150.0–400.0)
RBC: 4.1 Mil/uL (ref 3.87–5.11)
RDW: 12.5 % (ref 11.5–15.5)
WBC: 8.5 10*3/uL (ref 4.0–10.5)

## 2019-08-25 LAB — COMPREHENSIVE METABOLIC PANEL
ALT: 37 U/L — ABNORMAL HIGH (ref 0–35)
AST: 25 U/L (ref 0–37)
Albumin: 3.9 g/dL (ref 3.5–5.2)
Alkaline Phosphatase: 44 U/L (ref 39–117)
BUN: 5 mg/dL — ABNORMAL LOW (ref 6–23)
CO2: 24 mEq/L (ref 19–32)
Calcium: 8.7 mg/dL (ref 8.4–10.5)
Chloride: 104 mEq/L (ref 96–112)
Creatinine, Ser: 0.39 mg/dL — ABNORMAL LOW (ref 0.40–1.20)
GFR: 188.65 mL/min (ref >60.00)
Glucose, Bld: 82 mg/dL (ref 70–99)
Potassium: 3.3 mEq/L — ABNORMAL LOW (ref 3.5–5.1)
Sodium: 135 mEq/L (ref 135–145)
Total Bilirubin: 0.3 mg/dL (ref 0.2–1.2)
Total Protein: 6.4 g/dL (ref 6.0–8.3)

## 2019-08-25 LAB — TSH: TSH: 1.28 u[IU]/mL (ref 0.35–4.50)

## 2019-08-25 LAB — IGA: IgA: 141 mg/dL (ref 68–378)

## 2019-08-25 NOTE — Progress Notes (Signed)
History of Present Illness: This is a 34 year old female referred by Lewis Moccasin, MD and Olivia Mackie MD for the evaluation of diarrhea that started about the 6th week of pregnancy she's now in the 21st week.  She had a COVID-19 in December 2020, prior to her pregnancy, that she describes as mild.  Diarrhea was a prominent symptom during COVID-19 infection.  Her diarrhea resolved in January.  She relates the acute onset of nausea, vomiting and diarrhea that lasted for about 2 days after eating a sandwich at a restaurant in late June.  This was at her 6th week of pregnancy.  Nausea, vomiting abated however the diarrhea has persisted.  She describes multiple, loose, watery stools following each meal.  Her diarrhea is only controlled when eating bland foods like toast or rice.  Imodium has been minimally effective in controlling diarrhea.  If she eats less her diarrhea is clearly less and she has been eating less. Unfortunately she has lost 4 pounds over the past few weeks.  She notes mild lower abdominal crampy discomfort prior to bowel movements that then resolves.  Prior to this she had been gaining about 1 pound per week.  She has a maternal grandmother with Crohn's disease.  No other family members with IBD and no family history of celiac disease.  No antibiotic use or travel prior to her symptoms.  She has nausea which is controlled and felt to be pregnancy related.  GERD was present during her first pregnancy and is present this pregnancy. GERD is currently well controlled with Nexium daily. Denies  constipation, change in stool caliber, melena, hematochezia, nausea, vomiting, dysphagia, reflux symptoms, chest pain.   Allergies  Allergen Reactions  . Amoxapine And Related Rash  . Amoxicillin Hives and Other (See Comments)    Has patient had a PCN reaction causing immediate rash, facial/tongue/throat swelling, SOB or lightheadedness with hypotension: No Has patient had a PCN reaction  causing severe rash involving mucus membranes or skin necrosis: No Has patient had a PCN reaction that required hospitalization No Has patient had a PCN reaction occurring within the last 10 years: Yes If all of the above answers are "NO", then may proceed with Cephalosporin use. Lip swelling  . Percocet [Oxycodone-Acetaminophen] Rash   Outpatient Medications Prior to Visit  Medication Sig Dispense Refill  . Doxylamine-Pyridoxine 10-10 MG TBEC Take 1 tablet by mouth 4 (four) times daily.    Marland Kitchen HYDROcodone-acetaminophen (NORCO/VICODIN) 5-325 MG tablet Take 1 tablet by mouth every 4 (four) hours as needed for up to 10 doses for moderate pain (migraine). 10 tablet 0  . Prenatal Vit-Fe Fumarate-FA (PRENATAL MULTIVITAMIN) TABS tablet Take 1 tablet by mouth daily at 12 noon.    . promethazine (PHENERGAN) 12.5 MG tablet Take by mouth.    . sertraline (ZOLOFT) 25 MG tablet Take 25 mg by mouth daily.    . SUMAtriptan (IMITREX) 25 MG tablet Take 25 mg by mouth 2 (two) times daily as needed.    . ALPRAZolam (XANAX) 0.25 MG tablet Take 1 tablet (0.25 mg total) by mouth at bedtime as needed for anxiety. 30 tablet 0  . metoCLOPramide (REGLAN) 10 MG tablet Take 1 tablet (10 mg total) by mouth every 6 (six) hours. 30 tablet 0  . Probiotic Product (PROBIOTIC PO) Take 1 capsule by mouth daily.    . SUMAtriptan (IMITREX) 100 MG tablet Take 1 tablet (100 mg total) by mouth 2 (two) times daily as needed for migraine.  May repeat x1 as needed.  Do not exceed 200 mg in 24 hours 10 tablet 3   No facility-administered medications prior to visit.   Past Medical History:  Diagnosis Date  . Anemia   . Anxiety   . Depression   . Fibrocystic breast changes   . GERD (gastroesophageal reflux disease)   . Migraines 12/2007   Past Surgical History:  Procedure Laterality Date  . CESAREAN SECTION N/A 05/08/2016   Procedure: CESAREAN SECTION;  Surgeon: Olivia Mackie, MD;  Location: [facility] BIRTHING SUITES;  Service:  Obstetrics;  Laterality: N/A;  . IUD REMOVAL  05/16/14  . TONSILLECTOMY    . TYMPANOSTOMY TUBE PLACEMENT     Social History   Socioeconomic History  . Marital status: Married    Spouse name: Not on file  . Number of children: Not on file  . Years of education: Not on file  . Highest education level: Not on file  Occupational History  . Not on file  Tobacco Use  . Smoking status: Never Smoker  . Smokeless tobacco: Never Used  Vaping Use  . Vaping Use: Never used  Substance and Sexual Activity  . Alcohol use: Not Currently    Alcohol/week: 0.0 standard drinks    Comment: less than 1 a week  . Drug use: No  . Sexual activity: Yes    Partners: Male    Birth control/protection: None  Other Topics Concern  . Not on file  Social History Narrative   Lives with husband and daughter   Caffeine use: Coffee daily   Soda daily   Right handed   Social Determinants of Health   Financial Resource Strain:   . Difficulty of Paying Living Expenses:   Food Insecurity:   . Worried About Programme researcher, broadcasting/film/video in the Last Year:   . Barista in the Last Year:   Transportation Needs:   . Freight forwarder (Medical):   Marland Kitchen Lack of Transportation (Non-Medical):   Physical Activity:   . Days of Exercise per Week:   . Minutes of Exercise per Session:   Stress:   . Feeling of Stress :   Social Connections:   . Frequency of Communication with Friends and Family:   . Frequency of Social Gatherings with Friends and Family:   . Attends Religious Services:   . Active Member of Clubs or Organizations:   . Attends Banker Meetings:   Marland Kitchen Marital Status:    Family History  Problem Relation Age of Onset  . Migraines Mother   . Alcohol abuse Mother   . Cerebral aneurysm Father   . Cerebral aneurysm Brother   . Tuberculosis Brother   . Crohn's disease Maternal Grandmother   . Cervical cancer Maternal Grandmother   . Ovarian cancer Paternal Grandmother       Review of  Systems: Pertinent positive and negative review of systems were noted in the above HPI section. All other review of systems were otherwise negative.   Physical Exam: General: Well developed, well nourished, no acute distress Head: Normocephalic and atraumatic Eyes:  sclerae anicteric, EOMI Ears: Normal auditory acuity Mouth: Not examined, mask on during Covid-19 pandemic Neck: Supple, no masses or thyromegaly Lungs: Clear throughout to auscultation Heart: Regular rate and rhythm; no murmurs, rubs or bruits Abdomen: Soft, non tender and non distended. Gravid uterus. No hepatosplenomegaly or hernias noted. Normal Bowel sounds Rectal: deferred Musculoskeletal: Symmetrical with no gross deformities  Skin: No lesions on visible  extremities Pulses:  Normal pulses noted Extremities: No clubbing, cyanosis, edema or deformities noted Neurological: Alert oriented x 4, grossly nonfocal Cervical Nodes:  No significant cervical adenopathy Inguinal Nodes: No significant inguinal adenopathy Psychological:  Alert and cooperative. Normal mood and affect   Assessment and Recommendations:  1. Diarrhea during pregnancy.  Symptoms followed an acute gastrointestinal illness in late June.  R/O active GI infection, post infectious diarrhea, IBD, lactose intolerance and other disorders. CBC, CMP, TSH, tTG, IgA and GI stool profile. Imodium 1-2 qid prn.  Encourage more regular use of Imodium for symptom control.  Lactose-free diet for 1 week and assess response. Attempt to add more food intake to resume normal pregnancy weight gain. REV in 4-6 weeks.   2. GERD. History of pregnancy related GERD. Nexium 20 mg po qd. Antireflux measures.   3. Pregnancy, [redacted] weeks gestation.    cc: Olivia Mackie, MD and Maryelizabeth Rowan, MD

## 2019-08-25 NOTE — Patient Instructions (Signed)
Your provider has requested that you go to the basement level for lab work before leaving today. Press "B" on the elevator. The lab is located at the first door on the left as you exit the elevator.  Start a lactose free diet x 1 week to see if this helps improve your symptoms.    Thank you for choosing me and [facility].  Venita Lick. Pleas Koch., MD., Clementeen Graham

## 2019-08-26 ENCOUNTER — Other Ambulatory Visit: Payer: Self-pay

## 2019-08-26 DIAGNOSIS — E876 Hypokalemia: Secondary | ICD-10-CM

## 2019-08-26 DIAGNOSIS — R7989 Other specified abnormal findings of blood chemistry: Secondary | ICD-10-CM

## 2019-08-26 DIAGNOSIS — R197 Diarrhea, unspecified: Secondary | ICD-10-CM

## 2019-08-26 LAB — TISSUE TRANSGLUTAMINASE, IGA: (tTG) Ab, IgA: 1 U/mL

## 2019-08-29 ENCOUNTER — Other Ambulatory Visit: Payer: Self-pay

## 2019-08-29 ENCOUNTER — Telehealth: Payer: Self-pay | Admitting: Gastroenterology

## 2019-08-29 LAB — GI PROFILE, STOOL, PCR
Adenovirus F 40/41: NOT DETECTED
Astrovirus: NOT DETECTED
C difficile toxin A/B: NOT DETECTED
Campylobacter: NOT DETECTED
Cryptosporidium: NOT DETECTED
Cyclospora cayetanensis: DETECTED — AB
Entamoeba histolytica: NOT DETECTED
Enteroaggregative E coli: NOT DETECTED
Enteropathogenic E coli: NOT DETECTED
Enterotoxigenic E coli: NOT DETECTED
Giardia lamblia: NOT DETECTED
Norovirus GI/GII: DETECTED — AB
Plesiomonas shigelloides: NOT DETECTED
Rotavirus A: NOT DETECTED
Salmonella: NOT DETECTED
Sapovirus: NOT DETECTED
Shiga-toxin-producing E coli: NOT DETECTED
Shigella/Enteroinvasive E coli: NOT DETECTED
Vibrio cholerae: NOT DETECTED
Vibrio: NOT DETECTED
Yersinia enterocolitica: NOT DETECTED

## 2019-08-29 NOTE — Telephone Encounter (Signed)
Patient is calling, she is asking for the results of her stool sample. she is also asking whatever treatment that Dr. Laury Deep if he can run it by her OB. She is pregnant and is not sure what is safe to take during pregnancy and wanted to make Dr. Russella Dar aware that she was.

## 2019-08-30 ENCOUNTER — Other Ambulatory Visit: Payer: Self-pay

## 2019-08-30 DIAGNOSIS — A074 Cyclosporiasis: Secondary | ICD-10-CM

## 2019-08-30 MED ORDER — SULFAMETHOXAZOLE-TRIMETHOPRIM 800-160 MG PO TABS: 1.0000 | ORAL_TABLET | Freq: Two times a day (BID) | ORAL | 0 refills | Status: DC

## 2019-08-30 NOTE — Telephone Encounter (Signed)
Lm on vm for patient to return call regarding contact infor for her OB.

## 2019-08-30 NOTE — Telephone Encounter (Signed)
Thank you for the note.  I reviewed Dr. Ardell Isaacs office note and the results of stool studies. I also performed a drug database search regarding Bactrim and alternate medicines for this infection (ciprofloxacin and nitazoxanide) and their use in pregnancy.  Then I spoke with this patient's OB physician, Dr. Olivia Mackie.   We discussed her diagnosis and plan. Bactrim is known to be highly effective for this infection, with efficacy of the alternate medicines uncertain.  Given all that, and since this patient is in the second trimester of pregnancy, Dr. Billy Coast felt the risk/benefit ratio favors using the bactrim. Please let the patient and her pharmacy know.  - HD

## 2019-08-30 NOTE — Telephone Encounter (Signed)
Spoke with patient, she is aware that we have discussed treatment with Dr. Billy Coast and that he agreed on choice of Bactrim as treatment. Pt aware that RX was sent to her pharmacy, advised patient to call us if she had any concerns regarding side effects or any other concerns.

## 2019-08-30 NOTE — Telephone Encounter (Signed)
Dr. Myrtie Neither as DOD on 08/29/19 - please advise. 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 Russella Dar wanted to send in TMP SMX DS 1 po bid, #20 for treatment but this was flagged - patient is currently pregnant. Please advise on alternative medication, thank you

## 2019-08-30 NOTE — Telephone Encounter (Signed)
Spoke with patient, she states that he OB is Dr. Olivia Mackie @  [facility] -  Dr. Jorene Minors cell: [phone] Office number: [phone]  Dr. Myrtie Neither will contact Dr. Billy Coast in regards to treatment as DOD on 08/29/2019.

## 2019-09-02 ENCOUNTER — Other Ambulatory Visit (INDEPENDENT_AMBULATORY_CARE_PROVIDER_SITE_OTHER): Payer: BC Managed Care – PPO

## 2019-09-02 DIAGNOSIS — E876 Hypokalemia: Secondary | ICD-10-CM

## 2019-09-02 DIAGNOSIS — R7989 Other specified abnormal findings of blood chemistry: Secondary | ICD-10-CM

## 2019-09-02 LAB — HEPATIC FUNCTION PANEL
ALT: 29 U/L (ref 0–35)
AST: 22 U/L (ref 0–37)
Albumin: 3.9 g/dL (ref 3.5–5.2)
Alkaline Phosphatase: 45 U/L (ref 39–117)
Bilirubin, Direct: 0 mg/dL (ref 0.0–0.3)
Total Bilirubin: 0.3 mg/dL (ref 0.2–1.2)
Total Protein: 6.3 g/dL (ref 6.0–8.3)

## 2019-09-02 LAB — BASIC METABOLIC PANEL
BUN: 7 mg/dL (ref 6–23)
CO2: 23 mEq/L (ref 19–32)
Calcium: 9.1 mg/dL (ref 8.4–10.5)
Chloride: 103 mEq/L (ref 96–112)
Creatinine, Ser: 0.54 mg/dL (ref 0.40–1.20)
GFR: 129.57 mL/min (ref >60.00)
Glucose, Bld: 99 mg/dL (ref 70–99)
Potassium: 3.6 mEq/L (ref 3.5–5.1)
Sodium: 135 mEq/L (ref 135–145)

## 2019-10-05 ENCOUNTER — Ambulatory Visit: Payer: BC Managed Care – PPO | Admitting: Gastroenterology

## 2019-11-29 LAB — OB RESULTS CONSOLE GBS: GBS: NEGATIVE

## 2019-12-08 ENCOUNTER — Other Ambulatory Visit: Payer: Self-pay | Admitting: Obstetrics and Gynecology

## 2019-12-12 ENCOUNTER — Telehealth (HOSPITAL_COMMUNITY): Payer: Self-pay | Admitting: *Deleted

## 2019-12-12 NOTE — Telephone Encounter (Signed)
Preadmission screen  

## 2019-12-12 NOTE — Patient Instructions (Signed)
Abigail George  12/12/2019   Your procedure is scheduled on:  12/26/2019  Arrive at 0745 at Entrance C on [address] at [facility]  and [address]. You are invited to use the FREE valet parking or use the Visitor's parking deck.  Pick up the phone at the desk and dial [phone].  Call this number if you have problems the morning of surgery: [phone]  Remember:   Do not eat food:(After Midnight) Desps de medianoche.  Do not drink clear liquids: (After Midnight) Desps de medianoche.  Take these medicines the morning of surgery with A SIP OF WATER:  Take zoloft as prescribed   Do not wear jewelry, make-up or nail polish.  Do not wear lotions, powders, or perfumes. Do not wear deodorant.  Do not shave 48 hours prior to surgery.  Do not bring valuables to the hospital.  [facility] is not   responsible for any belongings or valuables brought to the hospital.  Contacts, dentures or bridgework may not be worn into surgery.  Leave suitcase in the car. After surgery it may be brought to your room.  For patients admitted to the hospital, checkout time is 11:00 AM the day of              discharge.      Please read over the following fact sheets that you were given:     Preparing for Surgery

## 2019-12-13 ENCOUNTER — Telehealth (HOSPITAL_COMMUNITY): Payer: Self-pay | Admitting: *Deleted

## 2019-12-13 NOTE — Telephone Encounter (Signed)
Preadmission screen  

## 2019-12-14 ENCOUNTER — Telehealth (HOSPITAL_COMMUNITY): Payer: Self-pay | Admitting: *Deleted

## 2019-12-14 NOTE — Telephone Encounter (Signed)
Preadmission screen  

## 2019-12-19 ENCOUNTER — Encounter (HOSPITAL_COMMUNITY): Payer: Self-pay

## 2019-12-23 ENCOUNTER — Encounter (HOSPITAL_COMMUNITY)
Admission: RE | Admit: 2019-12-23 | Discharge: 2019-12-23 | Disposition: A | Discharge status: Home / Self Care | Payer: BC Managed Care – PPO | Source: Ambulatory Visit | Attending: Obstetrics and Gynecology | Admitting: Obstetrics and Gynecology

## 2019-12-23 ENCOUNTER — Other Ambulatory Visit (HOSPITAL_COMMUNITY)
Admission: RE | Admit: 2019-12-23 | Discharge: 2019-12-23 | Disposition: A | Discharge status: Home / Self Care | Payer: BC Managed Care – PPO | Source: Ambulatory Visit | Attending: Obstetrics and Gynecology | Admitting: Obstetrics and Gynecology

## 2019-12-23 ENCOUNTER — Other Ambulatory Visit: Payer: Self-pay

## 2019-12-23 DIAGNOSIS — Z01812 Encounter for preprocedural laboratory examination: Secondary | ICD-10-CM | POA: Insufficient documentation

## 2019-12-23 DIAGNOSIS — Z20822 Contact with and (suspected) exposure to covid-19: Secondary | ICD-10-CM | POA: Insufficient documentation

## 2019-12-23 LAB — SARS CORONAVIRUS 2 (TAT 6-24 HRS): SARS Coronavirus 2: NEGATIVE

## 2019-12-23 LAB — CBC
HCT: 35.6 % — ABNORMAL LOW (ref 36.0–46.0)
Hemoglobin: 12.7 g/dL (ref 12.0–15.0)
MCH: 29.9 pg (ref 26.0–34.0)
MCHC: 35.7 g/dL (ref 30.0–36.0)
MCV: 83.8 fL (ref 80.0–100.0)
Platelets: 226 10*3/uL (ref 150–400)
RBC: 4.25 MIL/uL (ref 3.87–5.11)
RDW: 12.6 % (ref 11.5–15.5)
WBC: 6.4 10*3/uL (ref 4.0–10.5)
nRBC: 0 % (ref 0.0–0.2)

## 2019-12-23 LAB — TYPE AND SCREEN
ABO/RH(D): O POS
Antibody Screen: NEGATIVE

## 2019-12-23 LAB — RPR: RPR Ser Ql: NONREACTIVE

## 2019-12-25 NOTE — H&P (Signed)
Abigail George is a 34 y.o. female presenting for rpt csection.EFW 4500gms OB History    Gravida  3   Para  1   Term  1   Preterm  0   AB  1   Living  1     SAB  1   TAB  0   Ectopic  0   Multiple  0   Live Births  1          Past Medical History:  Diagnosis Date  . Anemia   . Anxiety   . Depression   . Fibrocystic breast changes   . GERD (gastroesophageal reflux disease)   . Migraines 12/2007   Past Surgical History:  Procedure Laterality Date  . CESAREAN SECTION N/A 05/08/2016   Procedure: CESAREAN SECTION;  Surgeon: Olivia Mackie, MD;  Location: [facility] BIRTHING SUITES;  Service: Obstetrics;  Laterality: N/A;  . IUD REMOVAL  05/16/14  . TONSILLECTOMY    . TYMPANOSTOMY TUBE PLACEMENT     Family History: family history includes Alcohol abuse in her mother; Cerebral aneurysm in her brother and father; Cervical cancer in her maternal grandmother; Crohn's disease in her maternal grandmother; Migraines in her mother; Ovarian cancer in her paternal grandmother; Tuberculosis in her brother. Social History:  reports that she has never smoked. She has never used smokeless tobacco. She reports previous alcohol use. She reports that she does not use drugs.     Maternal Diabetes: No Genetic Screening: Normal Maternal Ultrasounds/Referrals: Normal Fetal Ultrasounds or other Referrals:  None Maternal Substance Abuse:  No Significant Maternal Medications:  None Significant Maternal Lab Results:  Group B Strep negative Other Comments:  None  Review of Systems  Constitutional: Negative.   All other systems reviewed and are negative.  Maternal Medical History:  Contractions: Frequency: regular and rare.   Perceived severity is mild.    Fetal activity: Perceived fetal activity is normal.   Last perceived fetal movement was within the past hour.    Prenatal complications: Polyhydramnios.   Prenatal Complications - Diabetes: none.    CBC    Component Value  Date/Time   WBC 6.4 12/23/2019 0923   RBC 4.25 12/23/2019 0923   HGB 12.7 12/23/2019 0923   HGB 14.6 05/04/2012 1647   HCT 35.6 (L) 12/23/2019 0923   PLT 226 12/23/2019 0923   MCV 83.8 12/23/2019 0923   MCV 91.3 10/25/2014 1910   MCH 29.9 12/23/2019 0923   MCHC 35.7 12/23/2019 0923   RDW 12.6 12/23/2019 0923   LYMPHSABS 1.4 08/25/2019 0925   MONOABS 0.6 08/25/2019 0925   EOSABS 0.0 08/25/2019 0925   BASOSABS 0.0 08/25/2019 0925      unknown if currently breastfeeding. Maternal Exam:  Uterine Assessment: Contraction strength is mild.  Contraction frequency is irregular.   Abdomen: Patient reports no abdominal tenderness. Surgical scars: low transverse.   Fetal presentation: vertex  Introitus: Normal vulva. Normal vagina.  Ferning test: not done.  Nitrazine test: not done. Amniotic fluid character: not assessed.  Pelvis: questionable for delivery.   Cervix: Cervix evaluated by digital exam.     Physical Exam Vitals and nursing note reviewed. Exam conducted with a chaperone present.  Constitutional:      Appearance: Normal appearance. She is normal weight.  HENT:     Head: Normocephalic and atraumatic.  Cardiovascular:     Rate and Rhythm: Normal rate and regular rhythm.     Pulses: Normal pulses.     Heart sounds: Normal heart  sounds.  Pulmonary:     Effort: Pulmonary effort is normal.     Breath sounds: Normal breath sounds.  Abdominal:     Palpations: Abdomen is soft.  Genitourinary:    General: Normal vulva.  Musculoskeletal:        General: Normal range of motion.     Cervical back: Normal range of motion and neck supple.  Skin:    General: Skin is warm and dry.  Neurological:     General: No focal deficit present.     Mental Status: She is alert and oriented to person, place, and time.  Psychiatric:        Mood and Affect: Mood normal.        Behavior: Behavior normal.     Prenatal labs: ABO, Rh: --/--/O POS (12/03 0931) Antibody: NEG (12/03  0931) Rubella:  imm RPR: NON REACTIVE (12/03 0923)  HBsAg:   neg HIV:   neg GBS:   neg  Assessment/Plan: 39 wk IUP LGA with poly Previous csection for rpt. Rpt csection. Surgical consent done.   Aleila Syverson J 12/25/2019, 6:54 PM

## 2019-12-26 ENCOUNTER — Other Ambulatory Visit: Payer: Self-pay

## 2019-12-26 ENCOUNTER — Inpatient Hospital Stay (HOSPITAL_COMMUNITY): Payer: BC Managed Care – PPO | Admitting: Anesthesiology

## 2019-12-26 ENCOUNTER — Inpatient Hospital Stay (HOSPITAL_COMMUNITY)
Admission: RE | Admit: 2019-12-26 | Discharge: 2019-12-28 | DRG: 788 | Disposition: A | Discharge status: Home / Self Care | Payer: BC Managed Care – PPO | Attending: Obstetrics and Gynecology | Admitting: Obstetrics and Gynecology

## 2019-12-26 ENCOUNTER — Encounter (HOSPITAL_COMMUNITY): Payer: Self-pay | Admitting: Obstetrics and Gynecology

## 2019-12-26 ENCOUNTER — Encounter (HOSPITAL_COMMUNITY)
Admission: RE | Disposition: A | Discharge status: Home / Self Care | Payer: Self-pay | Source: Home / Self Care | Attending: Obstetrics and Gynecology

## 2019-12-26 DIAGNOSIS — Z885 Allergy status to narcotic agent status: Secondary | ICD-10-CM

## 2019-12-26 DIAGNOSIS — F32A Depression, unspecified: Secondary | ICD-10-CM | POA: Diagnosis present

## 2019-12-26 DIAGNOSIS — O34211 Maternal care for low transverse scar from previous cesarean delivery: Secondary | ICD-10-CM | POA: Diagnosis present

## 2019-12-26 DIAGNOSIS — Z20822 Contact with and (suspected) exposure to covid-19: Secondary | ICD-10-CM | POA: Diagnosis present

## 2019-12-26 DIAGNOSIS — Z3A39 39 weeks gestation of pregnancy: Secondary | ICD-10-CM | POA: Diagnosis not present

## 2019-12-26 DIAGNOSIS — O3663X0 Maternal care for excessive fetal growth, third trimester, not applicable or unspecified: Secondary | ICD-10-CM | POA: Diagnosis present

## 2019-12-26 DIAGNOSIS — F419 Anxiety disorder, unspecified: Secondary | ICD-10-CM | POA: Diagnosis present

## 2019-12-26 DIAGNOSIS — Z98891 History of uterine scar from previous surgery: Secondary | ICD-10-CM

## 2019-12-26 DIAGNOSIS — O99344 Other mental disorders complicating childbirth: Secondary | ICD-10-CM | POA: Diagnosis present

## 2019-12-26 DIAGNOSIS — O34219 Maternal care for unspecified type scar from previous cesarean delivery: Secondary | ICD-10-CM | POA: Diagnosis present

## 2019-12-26 SURGERY — Surgical Case
Anesthesia: Spinal

## 2019-12-26 MED ORDER — DIPHENHYDRAMINE HCL 25 MG PO CAPS: 25.0000 mg | ORAL_CAPSULE | Freq: Four times a day (QID) | ORAL | Status: DC | PRN

## 2019-12-26 MED ORDER — DIPHENHYDRAMINE HCL 50 MG/ML IJ SOLN: 12.5000 mg | INTRAMUSCULAR | Status: DC | PRN

## 2019-12-26 MED ORDER — POVIDONE-IODINE 10 % EX SWAB
2.0000 "application " | Freq: Once | CUTANEOUS | Status: AC
  Administered 2019-12-26: 2 via TOPICAL

## 2019-12-26 MED ORDER — OXYCODONE-ACETAMINOPHEN 5-325 MG PO TABS: 1.0000 | ORAL_TABLET | ORAL | Status: DC | PRN

## 2019-12-26 MED ORDER — BUPIVACAINE HCL (PF) 0.25 % IJ SOLN
INTRAMUSCULAR | Status: AC
  Filled 2019-12-26: qty 10

## 2019-12-26 MED ORDER — BUPIVACAINE HCL (PF) 0.25 % IJ SOLN
INTRAMUSCULAR | Status: DC | PRN
  Administered 2019-12-26: 20 mL

## 2019-12-26 MED ORDER — PRENATAL MULTIVITAMIN CH
1.0000 | ORAL_TABLET | Freq: Every day | ORAL | Status: DC
  Administered 2019-12-27 – 2019-12-28 (×2): 1 via ORAL
  Filled 2019-12-26 (×2): qty 1

## 2019-12-26 MED ORDER — SENNOSIDES-DOCUSATE SODIUM 8.6-50 MG PO TABS
2.0000 | ORAL_TABLET | ORAL | Status: DC
  Administered 2019-12-26 – 2019-12-27 (×2): 2 via ORAL
  Filled 2019-12-26 (×2): qty 2

## 2019-12-26 MED ORDER — KETOROLAC TROMETHAMINE 30 MG/ML IJ SOLN
INTRAMUSCULAR | Status: AC
  Filled 2019-12-26: qty 1

## 2019-12-26 MED ORDER — TRANEXAMIC ACID-NACL 1000-0.7 MG/100ML-% IV SOLN
INTRAVENOUS | Status: DC | PRN
  Administered 2019-12-26: 1000 mg via INTRAVENOUS

## 2019-12-26 MED ORDER — BUPIVACAINE IN DEXTROSE 0.75-8.25 % IT SOLN
INTRATHECAL | Status: DC | PRN
  Administered 2019-12-26: 1.5 mL via INTRATHECAL

## 2019-12-26 MED ORDER — METHYLERGONOVINE MALEATE 0.2 MG/ML IJ SOLN: 0.2000 mg | INTRAMUSCULAR | Status: DC | PRN

## 2019-12-26 MED ORDER — SCOPOLAMINE 1 MG/3DAYS TD PT72
1.0000 | MEDICATED_PATCH | Freq: Once | TRANSDERMAL | Status: DC
  Administered 2019-12-26: 1.5 mg via TRANSDERMAL

## 2019-12-26 MED ORDER — SODIUM CHLORIDE 0.9% FLUSH: 3.0000 mL | INTRAVENOUS | Status: DC | PRN

## 2019-12-26 MED ORDER — KETOROLAC TROMETHAMINE 30 MG/ML IJ SOLN
30.0000 mg | Freq: Four times a day (QID) | INTRAMUSCULAR | Status: AC
  Administered 2019-12-26 – 2019-12-27 (×3): 30 mg via INTRAVENOUS
  Filled 2019-12-26 (×3): qty 1

## 2019-12-26 MED ORDER — PHENYLEPHRINE HCL-NACL 20-0.9 MG/250ML-% IV SOLN
INTRAVENOUS | Status: DC | PRN
  Administered 2019-12-26: 60 ug/min via INTRAVENOUS

## 2019-12-26 MED ORDER — FENTANYL CITRATE (PF) 100 MCG/2ML IJ SOLN: 25.0000 ug | INTRAMUSCULAR | Status: DC | PRN

## 2019-12-26 MED ORDER — OXYTOCIN-SODIUM CHLORIDE 30-0.9 UT/500ML-% IV SOLN: 2.5000 [IU]/h | INTRAVENOUS | Status: AC

## 2019-12-26 MED ORDER — NALOXONE HCL 4 MG/10ML IJ SOLN
1.0000 ug/kg/h | INTRAVENOUS | Status: DC | PRN
  Filled 2019-12-26: qty 5

## 2019-12-26 MED ORDER — SOD CITRATE-CITRIC ACID 500-334 MG/5ML PO SOLN
ORAL | Status: AC
  Filled 2019-12-26: qty 30

## 2019-12-26 MED ORDER — NALOXONE HCL 0.4 MG/ML IJ SOLN: 0.4000 mg | INTRAMUSCULAR | Status: DC | PRN

## 2019-12-26 MED ORDER — STERILE WATER FOR IRRIGATION IR SOLN
Status: DC | PRN
  Administered 2019-12-26: 1000 mL

## 2019-12-26 MED ORDER — OXYTOCIN-SODIUM CHLORIDE 30-0.9 UT/500ML-% IV SOLN
INTRAVENOUS | Status: AC
  Filled 2019-12-26: qty 500

## 2019-12-26 MED ORDER — CEFAZOLIN SODIUM-DEXTROSE 2-4 GM/100ML-% IV SOLN
INTRAVENOUS | Status: AC
  Filled 2019-12-26: qty 100

## 2019-12-26 MED ORDER — ACETAMINOPHEN 500 MG PO TABS
1000.0000 mg | ORAL_TABLET | Freq: Four times a day (QID) | ORAL | Status: DC
  Administered 2019-12-26 – 2019-12-27 (×5): 1000 mg via ORAL
  Filled 2019-12-26 (×5): qty 2

## 2019-12-26 MED ORDER — MORPHINE SULFATE (PF) 0.5 MG/ML IJ SOLN
INTRAMUSCULAR | Status: DC | PRN
  Administered 2019-12-26: .15 mg via INTRATHECAL

## 2019-12-26 MED ORDER — ONDANSETRON HCL 4 MG/2ML IJ SOLN
INTRAMUSCULAR | Status: AC
  Filled 2019-12-26: qty 2

## 2019-12-26 MED ORDER — COCONUT OIL OIL: 1.0000 "application " | TOPICAL_OIL | Status: DC | PRN

## 2019-12-26 MED ORDER — ONDANSETRON HCL 4 MG/2ML IJ SOLN
INTRAMUSCULAR | Status: DC | PRN
  Administered 2019-12-26: 4 mg via INTRAVENOUS

## 2019-12-26 MED ORDER — NALBUPHINE HCL 10 MG/ML IJ SOLN: 5.0000 mg | Freq: Once | INTRAMUSCULAR | Status: DC | PRN

## 2019-12-26 MED ORDER — FENTANYL CITRATE (PF) 100 MCG/2ML IJ SOLN
INTRAMUSCULAR | Status: AC
  Filled 2019-12-26: qty 2

## 2019-12-26 MED ORDER — KETOROLAC TROMETHAMINE 30 MG/ML IJ SOLN
30.0000 mg | Freq: Once | INTRAMUSCULAR | Status: AC | PRN
  Administered 2019-12-26: 30 mg via INTRAVENOUS

## 2019-12-26 MED ORDER — METHYLERGONOVINE MALEATE 0.2 MG PO TABS: 0.2000 mg | ORAL_TABLET | ORAL | Status: DC | PRN

## 2019-12-26 MED ORDER — OXYCODONE HCL 5 MG PO TABS: 5.0000 mg | ORAL_TABLET | ORAL | Status: DC | PRN

## 2019-12-26 MED ORDER — NALBUPHINE HCL 10 MG/ML IJ SOLN: 5.0000 mg | INTRAMUSCULAR | Status: DC | PRN

## 2019-12-26 MED ORDER — OXYTOCIN-SODIUM CHLORIDE 30-0.9 UT/500ML-% IV SOLN
INTRAVENOUS | Status: DC | PRN
  Administered 2019-12-26 (×2): 30 [IU] via INTRAVENOUS

## 2019-12-26 MED ORDER — IBUPROFEN 800 MG PO TABS
800.0000 mg | ORAL_TABLET | Freq: Four times a day (QID) | ORAL | Status: DC
  Administered 2019-12-27 – 2019-12-28 (×5): 800 mg via ORAL
  Filled 2019-12-26 (×5): qty 1

## 2019-12-26 MED ORDER — NALBUPHINE HCL 10 MG/ML IJ SOLN
5.0000 mg | INTRAMUSCULAR | Status: DC | PRN
  Administered 2019-12-26 – 2019-12-27 (×2): 5 mg via INTRAVENOUS
  Filled 2019-12-26 (×2): qty 1

## 2019-12-26 MED ORDER — ONDANSETRON HCL 4 MG/2ML IJ SOLN: 4.0000 mg | Freq: Three times a day (TID) | INTRAMUSCULAR | Status: DC | PRN

## 2019-12-26 MED ORDER — SIMETHICONE 80 MG PO CHEW
80.0000 mg | CHEWABLE_TABLET | ORAL | Status: DC | PRN
  Administered 2019-12-27: 80 mg via ORAL
  Filled 2019-12-26: qty 1

## 2019-12-26 MED ORDER — SIMETHICONE 80 MG PO CHEW
80.0000 mg | CHEWABLE_TABLET | Freq: Three times a day (TID) | ORAL | Status: DC
  Administered 2019-12-26 – 2019-12-28 (×4): 80 mg via ORAL
  Filled 2019-12-26 (×4): qty 1

## 2019-12-26 MED ORDER — MENTHOL 3 MG MT LOZG: 1.0000 | LOZENGE | OROMUCOSAL | Status: DC | PRN

## 2019-12-26 MED ORDER — ZOLPIDEM TARTRATE 5 MG PO TABS: 5.0000 mg | ORAL_TABLET | Freq: Every evening | ORAL | Status: DC | PRN

## 2019-12-26 MED ORDER — ONDANSETRON HCL 4 MG/2ML IJ SOLN: 4.0000 mg | Freq: Once | INTRAMUSCULAR | Status: DC | PRN

## 2019-12-26 MED ORDER — CEFAZOLIN SODIUM-DEXTROSE 2-4 GM/100ML-% IV SOLN
2.0000 g | INTRAVENOUS | Status: AC
  Administered 2019-12-26: 2 g via INTRAVENOUS

## 2019-12-26 MED ORDER — PHENYLEPHRINE HCL-NACL 20-0.9 MG/250ML-% IV SOLN
INTRAVENOUS | Status: AC
  Filled 2019-12-26: qty 250

## 2019-12-26 MED ORDER — DIBUCAINE (PERIANAL) 1 % EX OINT: 1.0000 "application " | TOPICAL_OINTMENT | CUTANEOUS | Status: DC | PRN

## 2019-12-26 MED ORDER — SCOPOLAMINE 1 MG/3DAYS TD PT72
MEDICATED_PATCH | TRANSDERMAL | Status: AC
  Filled 2019-12-26: qty 1

## 2019-12-26 MED ORDER — MEPERIDINE HCL 25 MG/ML IJ SOLN: 6.2500 mg | INTRAMUSCULAR | Status: DC | PRN

## 2019-12-26 MED ORDER — SIMETHICONE 80 MG PO CHEW
80.0000 mg | CHEWABLE_TABLET | ORAL | Status: DC
  Administered 2019-12-26 – 2019-12-27 (×2): 80 mg via ORAL
  Filled 2019-12-26 (×2): qty 1

## 2019-12-26 MED ORDER — WITCH HAZEL-GLYCERIN EX PADS: 1.0000 "application " | MEDICATED_PAD | CUTANEOUS | Status: DC | PRN

## 2019-12-26 MED ORDER — LACTATED RINGERS IV SOLN: INTRAVENOUS | Status: DC

## 2019-12-26 MED ORDER — FENTANYL CITRATE (PF) 100 MCG/2ML IJ SOLN
INTRAMUSCULAR | Status: DC | PRN
  Administered 2019-12-26: 15 ug via INTRATHECAL

## 2019-12-26 MED ORDER — TETANUS-DIPHTH-ACELL PERTUSSIS 5-2.5-18.5 LF-MCG/0.5 IM SUSY: 0.5000 mL | PREFILLED_SYRINGE | Freq: Once | INTRAMUSCULAR | Status: DC

## 2019-12-26 MED ORDER — DIPHENHYDRAMINE HCL 25 MG PO CAPS: 25.0000 mg | ORAL_CAPSULE | ORAL | Status: DC | PRN

## 2019-12-26 MED ORDER — MORPHINE SULFATE (PF) 0.5 MG/ML IJ SOLN
INTRAMUSCULAR | Status: AC
  Filled 2019-12-26: qty 10

## 2019-12-26 MED ORDER — TRANEXAMIC ACID-NACL 1000-0.7 MG/100ML-% IV SOLN: 1000.0000 mg | INTRAVENOUS | Status: DC

## 2019-12-26 SURGICAL SUPPLY — 38 items
APL SKNCLS STERI-STRIP NONHPOA (GAUZE/BANDAGES/DRESSINGS) ×1
BENZOIN TINCTURE PRP APPL 2/3 (GAUZE/BANDAGES/DRESSINGS) ×2 IMPLANT
CHLORAPREP W/TINT 26ML (MISCELLANEOUS) ×3 IMPLANT
CLAMP CORD UMBIL (MISCELLANEOUS) IMPLANT
CLOSURE STERI-STRIP 1/2X4 (GAUZE/BANDAGES/DRESSINGS) ×1
CLOTH BEACON ORANGE TIMEOUT ST (SAFETY) ×3 IMPLANT
CLSR STERI-STRIP ANTIMIC 1/2X4 (GAUZE/BANDAGES/DRESSINGS) ×2 IMPLANT
DRSG OPSITE POSTOP 4X10 (GAUZE/BANDAGES/DRESSINGS) ×3 IMPLANT
ELECT REM PT RETURN 9FT ADLT (ELECTROSURGICAL) ×3
ELECTRODE REM PT RTRN 9FT ADLT (ELECTROSURGICAL) ×1 IMPLANT
EXTRACTOR VACUUM M CUP 4 TUBE (SUCTIONS) IMPLANT
EXTRACTOR VACUUM M CUP 4' TUBE (SUCTIONS)
GLOVE BIO SURGEON STRL SZ7.5 (GLOVE) ×3 IMPLANT
GLOVE BIOGEL PI IND STRL 7.0 (GLOVE) ×1 IMPLANT
GLOVE BIOGEL PI INDICATOR 7.0 (GLOVE) ×2
GOWN STRL REUS W/TWL LRG LVL3 (GOWN DISPOSABLE) ×6 IMPLANT
KIT ABG SYR 3ML LUER SLIP (SYRINGE) IMPLANT
NDL SPNL 20GX3.5 QUINCKE YW (NEEDLE) IMPLANT
NEEDLE HYPO 22GX1.5 SAFETY (NEEDLE) ×3 IMPLANT
NEEDLE HYPO 25X5/8 SAFETYGLIDE (NEEDLE) IMPLANT
NEEDLE SPNL 20GX3.5 QUINCKE YW (NEEDLE) IMPLANT
NS IRRIG 1000ML POUR BTL (IV SOLUTION) ×3 IMPLANT
PACK C SECTION WH (CUSTOM PROCEDURE TRAY) ×3 IMPLANT
PENCIL SMOKE EVAC W/HOLSTER (ELECTROSURGICAL) ×3 IMPLANT
SUT MNCRL 0 VIOLET CTX 36 (SUTURE) ×2 IMPLANT
SUT MNCRL AB 3-0 PS2 27 (SUTURE) IMPLANT
SUT MON AB 2-0 CT1 27 (SUTURE) ×3 IMPLANT
SUT MON AB-0 CT1 36 (SUTURE) ×6 IMPLANT
SUT MONOCRYL 0 CTX 36 (SUTURE) ×6
SUT PLAIN 0 NONE (SUTURE) IMPLANT
SUT PLAIN 2 0 (SUTURE)
SUT PLAIN 2 0 XLH (SUTURE) IMPLANT
SUT PLAIN ABS 2-0 CT1 27XMFL (SUTURE) IMPLANT
SYR 20CC LL (SYRINGE) IMPLANT
SYR CONTROL 10ML LL (SYRINGE) ×3 IMPLANT
TOWEL OR 17X24 6PK STRL BLUE (TOWEL DISPOSABLE) ×3 IMPLANT
TRAY FOLEY W/BAG SLVR 14FR LF (SET/KITS/TRAYS/PACK) ×3 IMPLANT
WATER STERILE IRR 1000ML POUR (IV SOLUTION) ×3 IMPLANT

## 2019-12-26 NOTE — Progress Notes (Signed)
Pt requesting Tylenol for pain.Pt states " I am able to take plain Tylenol at home" .  Pt also stated that " Percocet causes me to have  hives from head to toe." Arlan Organ, CNM notified for Tylenol order. Orders given for Tylenol and Oxycodone IR. Arlan Organ,  CNM advised of pt allergy to Percocet and pt's complaint of hives from taking Percocet in the past. Arlan Organ, CNM choose to continue with Oxycodone IR order.  Will continue to monitor pt.

## 2019-12-26 NOTE — Anesthesia Preprocedure Evaluation (Signed)
Anesthesia Evaluation  Patient identified by MRN, date of birth, ID band Patient awake    Reviewed: Patient's Chart, lab work & pertinent test results  Airway Mallampati: II  TM Distance: >3 FB Neck ROM: Full    Dental  (+) Teeth Intact   Pulmonary    Pulmonary exam normal        Cardiovascular negative cardio ROS   Rhythm:Regular Rate:Normal     Neuro/Psych  Headaches, Anxiety Depression    GI/Hepatic Neg liver ROS, GERD  Medicated,  Endo/Other  negative endocrine ROS  Renal/GU negative Renal ROS  negative genitourinary   Musculoskeletal negative musculoskeletal ROS (+)   Abdominal (+)  Abdomen: soft. Bowel sounds: normal.  Peds  Hematology  (+) anemia ,   Anesthesia Other Findings   Reproductive/Obstetrics (+) Pregnancy                             Anesthesia Physical Anesthesia Plan  ASA: II  Anesthesia Plan: Spinal   Post-op Pain Management:    Induction:   PONV Risk Score and Plan: 2 and Ondansetron and Dexamethasone  Airway Management Planned: Simple Face Mask, Natural Airway and Nasal Cannula  Additional Equipment: None  Intra-op Plan:   Post-operative Plan:   Informed Consent: I have reviewed the patients History and Physical, chart, labs and discussed the procedure including the risks, benefits and alternatives for the proposed anesthesia with the patient or authorized representative who has indicated his/her understanding and acceptance.     Dental advisory given  Plan Discussed with: CRNA  Anesthesia Plan Comments: (Lab Results      Component                Value               Date                      WBC                      6.4                 12/23/2019                HGB                      12.7                12/23/2019                HCT                      35.6 (L)            12/23/2019                MCV                      83.8                 12/23/2019                PLT                      226                 12/23/2019          )  Anesthesia Quick Evaluation  

## 2019-12-26 NOTE — Social Work (Signed)
MOB was referred for history of depression and anxiety.   * Referral screened out by Clinical Social Worker because none of the following criteria appear to apply:  ~ History of anxiety/depression during this pregnancy, or of post-partum depression following prior delivery. ~ Diagnosis of anxiety and/or depression within last 3 years. OR * MOB's symptoms currently being treated with medication and/or therapy. CSW reviewed chart and notes a active prescription for Zoloft 25 mg.  Please contact the Clinical Social Worker if needs arise, by [facility] request, or if MOB scores greater than 9/yes to question 10 on Edinburgh Postpartum Depression Screen.  Manfred Arch, LCSWA Clinical Social Work [employer] and [employer]  [phone]

## 2019-12-26 NOTE — Transfer of Care (Signed)
Immediate Anesthesia Transfer of Care Note  Patient: Abigail George  Procedure(s) Performed: Repeat CESAREAN SECTION (N/A )  Patient Location: PACU  Anesthesia Type:Spinal  Level of Consciousness: awake, alert  and oriented  Airway & Oxygen Therapy: Patient Spontanous Breathing  Post-op Assessment: Report given to RN and Post -op Vital signs reviewed and stable  Post vital signs: Reviewed and stable  Last Vitals:  Vitals Value Taken Time  BP 121/68 12/26/19 1152  Temp 36.4 C 12/26/19 1152  Pulse 50 12/26/19 1152  Resp 18 12/26/19 1152  SpO2 100 % 12/26/19 1152    Last Pain:  Vitals:   12/26/19 1045  TempSrc: Oral         Complications: No complications documented.

## 2019-12-26 NOTE — Anesthesia Procedure Notes (Addendum)
Spinal  Patient location during procedure: OR Start time: 12/26/2019 9:35 AM End time: 12/26/2019 9:40 AM Staffing Performed: other anesthesia staff  Anesthesiologist: Atilano Median, DO Resident/CRNA: Elgie Congo, CRNA Preanesthetic Checklist Completed: patient identified, IV checked, risks and benefits discussed, surgical consent, monitors and equipment checked, pre-op evaluation and timeout performed Spinal Block Patient position: sitting Prep: ChloraPrep and site prepped and draped Patient monitoring: heart rate, continuous pulse ox and blood pressure Approach: midline Location: L3-4 Injection technique: single-shot Needle Needle type: Pencil-Tip  Needle gauge: 25 G Needle length: 9 cm Assessment Sensory level: T6 Additional Notes Performed by J'Woin Romeo Apple, SRNA. 1.45mL 0.75% Bupivacaine w/ 15 mcg fentanyl and 150 mcg duramorph injected into L3-4 Interspace. Aspriation of CSF fluid before injection. No complications during procedure.

## 2019-12-26 NOTE — Lactation Note (Signed)
This note was copied from a baby's chart. Lactation Consultation Note  Patient Name: Abigail George Date: 12/26/2019 Reason for consult: Initial assessment;Term;Other (Comment) (LGA)  Visited with mom of a 6 hours old FT female, she's a P2 and experienced BF. She BF her first child for 2 years and reported that her biggest BF difficulty was oversupply, she donated 3000 ounces of EBM with her first baby. She's also familiar with hand expression and already able to see drops of colostrum, praised her for her efforts. She has a Spectra DEBP at home.  Offered assistance with latch after baby's diaper change and mom agreed to have him STS. LC helped mom position baby in cross cradle hold to the right breast, but it took him a few tries/attempts before he could latch, baby would not open his mouth wide enough, when he finally did, he was able to sustain the latch for 12 minutes with a few audible swallows noted with breast compressions. Showed mom tricks for a deep latch and how to hold baby's head in order to sustain the latch.  Mom reported feedings at the breast are comfortable, she told LC that she has her lanolin "just in case". LC explained to mom the reason why lanolin is no longer given at the hospital anymore, but she voiced that coconut oil didn't do anything for her when she had her first baby. Reviewed normal newborn behavior, cluster feeding, feeding cues, size of baby's stomach, lactogenesis II and supply/demand.  Feeding plan  1. Encouraged mom to feed baby STS 8-12 times/24 hours or sooner if feeding cues are present 2. Hand expression and spoon feeding were also encouraged  BF brochure, BF resources and feeding dairy were reviewed. Dad present and very supportive. Parents reported all questions and concerns were answered, they're both aware of LC OP services and will call PRN.   Maternal Data Formula Feeding for Exclusion: No Has patient been taught Hand Expression?:  Yes Does the patient have breastfeeding experience prior to this delivery?: Yes  Feeding Feeding Type: Breast Fed  LATCH Score Latch: Repeated attempts needed to sustain latch, nipple held in mouth throughout feeding, stimulation needed to elicit sucking reflex. (baby would not open his mouth wide enough for a deep latch)  Audible Swallowing: A few with stimulation  Type of Nipple: Everted at rest and after stimulation  Comfort (Breast/Nipple): Soft / non-tender  Hold (Positioning): Assistance needed to correctly position infant at breast and maintain latch.  LATCH Score: 7  Interventions Interventions: Breast feeding basics reviewed;Assisted with latch;Skin to skin;Breast massage;Breast compression;Adjust position;Support pillows  Lactation Tools Discussed/Used WIC Program: No   Consult Status Consult Status: Follow-up Date: 12/27/19 Follow-up type: In-patient    Abigail George 12/26/2019, 5:09 PM

## 2019-12-26 NOTE — Op Note (Signed)
Cesarean Section Procedure Note  Indications: macrosomia and previous uterine incision kerr x one  Pre-operative Diagnosis: 39 week 1 day pregnancy.  Post-operative Diagnosis: same  Surgeon: Lenoard Aden   Assistants: Renae Fickle, CNM  Anesthesia: Local anesthesia 0.25.% bupivacaine and Spinal anesthesia  ASA Class: 2  Procedure Details  The patient was seen in the Holding Room. The risks, benefits, complications, treatment options, and expected outcomes were discussed with the patient.  The patient concurred with the proposed plan, giving informed consent. The risks of anesthesia, infection, bleeding and possible injury to other organs discussed. Injury to bowel, bladder, or ureter with possible need for repair discussed. Possible need for transfusion with secondary risks of hepatitis or HIV acquisition discussed. Post operative complications to include but not limited to DVT, PE and Pneumonia noted. The site of surgery properly noted/marked. The patient was taken to Operating Room # B, identified as Abigail George and the procedure verified as C-Section Delivery. A Time Out was held and the above information confirmed.  After induction of anesthesia, the patient was draped and prepped in the usual sterile manner. A Pfannenstiel incision was made and carried down through the subcutaneous tissue to the fascia. Fascial incision was made and extended transversely using Mayo scissors. The fascia was separated from the underlying rectus tissue superiorly and inferiorly. The peritoneum was identified and entered. Peritoneal incision was extended longitudinally. The utero-vesical peritoneal reflection was incised transversely and the bladder flap was bluntly freed from the lower uterine segment. A low transverse uterine incision(Kerr hysterotomy) was made. Delivered from OA presentation was a  female with Apgar scores of 9 at one minute and 9 at five minutes. Bulb suctioning gently performed. Neonatal  team in attendance.After the umbilical cord was clamped and cut cord blood was obtained for evaluation. The placenta was removed intact and appeared normal. The uterus was curetted with a dry lap pack. Good hemostasis was noted.The uterine outline, tubes and ovaries appeared normal. The uterine incision was closed with running locked sutures of 0 Monocryl x 2 layers. Hemostasis was observed. The parietal peritoneum was closed with a running 2-0 Monocryl suture. The fascia was then reapproximated with running sutures of 0 Monocryl. The skin was reapproximated with 4-0 vicryl after [place] closure with 2-0 plain.  Instrument, sponge, and needle counts were correct prior the abdominal closure and at the conclusion of the case.   Findings: FTLM, OA, post placenta. Nl adnexa  Estimated Blood Loss:  600         Drains: foley                 Specimens: placenta to LD                 Complications:  None; patient tolerated the procedure well.         Disposition: PACU - hemodynamically stable.         Condition: stable  Attending Attestation: I performed the procedure.

## 2019-12-26 NOTE — Anesthesia Postprocedure Evaluation (Signed)
Anesthesia Post Note  Patient: Abigail George  Procedure(s) Performed: Repeat CESAREAN SECTION (N/A )     Patient location during evaluation: Mother Baby Anesthesia Type: Spinal Level of consciousness: oriented and awake and alert Pain management: pain level controlled Vital Signs Assessment: post-procedure vital signs reviewed and stable Respiratory status: spontaneous breathing and respiratory function stable Cardiovascular status: blood pressure returned to baseline and stable Postop Assessment: no headache, no backache, no apparent nausea or vomiting and able to ambulate Anesthetic complications: no   No complications documented.  Last Vitals:  Vitals:   12/26/19 1152 12/26/19 1252  BP: 121/68 118/68  Pulse: (!) 50 (!) 52  Resp: 18 16  Temp: (!) 36.4 C   SpO2: 100% 98%    Last Pain:  Vitals:   12/26/19 1252  TempSrc:   PainSc: 0-No pain   Pain Goal:                   Nelle Don Kaycie Pegues

## 2019-12-27 LAB — CBC
HCT: 33.5 % — ABNORMAL LOW (ref 36.0–46.0)
Hemoglobin: 11.3 g/dL — ABNORMAL LOW (ref 12.0–15.0)
MCH: 28.8 pg (ref 26.0–34.0)
MCHC: 33.7 g/dL (ref 30.0–36.0)
MCV: 85.5 fL (ref 80.0–100.0)
Platelets: 206 10*3/uL (ref 150–400)
RBC: 3.92 MIL/uL (ref 3.87–5.11)
RDW: 12.8 % (ref 11.5–15.5)
WBC: 9.9 10*3/uL (ref 4.0–10.5)
nRBC: 0 % (ref 0.0–0.2)

## 2019-12-27 LAB — BIRTH TISSUE RECOVERY COLLECTION (PLACENTA DONATION)

## 2019-12-27 MED ORDER — ACETAMINOPHEN 325 MG PO TABS: 650.0000 mg | ORAL_TABLET | ORAL | Status: DC | PRN

## 2019-12-27 MED ORDER — HYDROCODONE-ACETAMINOPHEN 5-325 MG PO TABS
1.0000 | ORAL_TABLET | Freq: Four times a day (QID) | ORAL | Status: DC | PRN
  Administered 2019-12-27 – 2019-12-28 (×3): 2 via ORAL
  Filled 2019-12-27 (×4): qty 2

## 2019-12-27 MED ORDER — SERTRALINE HCL 25 MG PO TABS
25.0000 mg | ORAL_TABLET | Freq: Every day | ORAL | Status: DC
  Administered 2019-12-28: 25 mg via ORAL
  Filled 2019-12-27 (×2): qty 1

## 2019-12-27 NOTE — Lactation Note (Signed)
This note was copied from a baby's chart. Lactation Consultation Note  Patient Name: Abigail George [facility]T Date: 12/27/2019 Reason for consult: Follow-up assessment;Other (Comment);Term;Infant weight loss;Nipple pain/trauma (experienced BF for 2 years, 5 % weight loss) Baby is 29 hours old , P 2 , at 24 hours Bili check - 4.6.  Per mom baby recently fed @ 2:50p for 25 mins. Baby asleep in moms arms.  Mom mentioned she feels the baby has a shallow latch on the left breast.  Per mom is using the cradle position when breast feeding on both breast.  LC recommended to consider trying the cross cradle and then switching her arms to the cradle once the baby is feeding and mom is comfortable. Also consider the football position, especially since she had a C/S.  LC provided comfort gels for after the feedings , alternating with shells while awake. LC recommended prior to latching on the 1st breast - breast massage, hand express, prepump and reverse pressure and then latch.  LC encouraged mom to call for a feeding assessment so soreness can be assessed.   Maternal Data    Feeding Feeding Type:  (per mom baby recently fed and LC updated the doc flow sheets per mom, dad and I/O sheet)  LATCH Score                   Interventions Interventions: Breast feeding basics reviewed  Lactation Tools Discussed/Used Tools: Shells;Comfort gels Shell Type: Inverted   Consult Status Consult Status: Follow-up Date: 12/27/19 Follow-up type: In-patient    Matilde Sprang Miche Loughridge 12/27/2019, 4:00 PM

## 2019-12-27 NOTE — Progress Notes (Signed)
POSTOPERATIVE DAY # 1 S/P Repeat LTCS for LGA, baby boy "Hart [place]"  S:         Reports feeling well, sore with activity. Managing well with Tylenol and Motrin. States she gets rash/hives-like reaction to oxycodone.  Tolerates Hydrocodone well             Tolerating po intake / no nausea / no vomiting / + flatus / no BM  Denies dizziness, SOB, or CP             Bleeding is light             Pain controlled withMotrin and Tylenol             Up ad lib / ambulatory/ voiding QS  Newborn breast feeding  / Circumcision - planned   O:  VS: BP 115/66 (BP Location: Right Arm)   Pulse (!) 59   Temp 97.9 F (36.6 C) (Axillary) Comment (Src): Pt just consumed ice water  Resp 18   Ht 5\' 10"  (1.778 m)   Wt 95.3 kg   SpO2 97%   Breastfeeding Yes   BMI 30.13 kg/m   Patient Vitals for the past 24 hrs:  BP Temp Temp src Pulse Resp SpO2  12/27/19 0601 -- 97.9 F (36.6 C) Axillary -- -- --  12/27/19 0529 115/66 -- -- (!) 59 18 --  12/27/19 0127 (!) 110/57 97.9 F (36.6 C) Oral 60 16 --  12/26/19 2136 127/70 98 F (36.7 C) Oral 66 17 97 %  12/26/19 1735 -- 98.2 F (36.8 C) Oral -- 16 98 %     LABS:              Recent Labs    12/27/19 0527  WBC 9.9  HGB 11.3*  PLT 206               Bloodtype: --/--/O POS (12/03 0931)  Rubella: Immune (05/13 0000)                                             I&O: Intake/Output      12/06 0701 - 12/07 0700 12/07 0701 - 12/08 0700   P.O. 480    I.V. (mL/kg) 1000 (10.5)    Total Intake(mL/kg) 1480 (15.5)    Urine (mL/kg/hr) 2850    Blood 750    Total Output 3600    Net -2120         Flu: UTD Tdap: UTD COVID: ?             Physical Exam:             Alert and Oriented X3  Lungs: Clear and unlabored  Heart: regular rate and rhythm / no murmurs  Abdomen: soft, non-tender, moderate gaseous distention             Fundus: firm, non-tender, below umbilicus              Dressing: honeycomb with steri-strips old shadowing marked, otherwise dry and  intact              Incision:  approximated with sutures / no erythema / no ecchymosis / no drainage  Perineum: intact  Lochia: small rubra on pad   Extremities: trace LE edema, no calf pain or tenderness,   A/P:     POD # 1 S/P Repeat  LTCS            Hx. Of Depression   - Restart Zoloft 25mg  daily   Vicodin 5-325mg  1-2 tablets every 6 hrs PRN; watch Tylenol intake   Routine postoperative care              Warm liquids and ambulation to promote bowel motility  Lactation support PRN   Desires early d/c home tomorrow   , MSN, CNM [facility]

## 2019-12-28 DIAGNOSIS — F32A Depression, unspecified: Secondary | ICD-10-CM | POA: Diagnosis present

## 2019-12-28 DIAGNOSIS — Z98891 History of uterine scar from previous surgery: Secondary | ICD-10-CM

## 2019-12-28 MED ORDER — IBUPROFEN 800 MG PO TABS
800.0000 mg | ORAL_TABLET | Freq: Four times a day (QID) | ORAL | 0 refills | Status: DC
Start: 2019-12-28 — End: 2022-05-10

## 2019-12-28 MED ORDER — COCONUT OIL OIL
1.0000 | TOPICAL_OIL | 0 refills | Status: DC | PRN
Start: 2019-12-28 — End: 2021-04-16

## 2019-12-28 MED ORDER — HYDROCODONE-ACETAMINOPHEN 5-325 MG PO TABS
1.0000 | ORAL_TABLET | Freq: Four times a day (QID) | ORAL | 0 refills | Status: DC | PRN
Start: 2019-12-28 — End: 2021-04-16

## 2019-12-28 NOTE — Lactation Note (Addendum)
This note was copied from a baby's chart. Lactation Consultation Note  Patient Name: Abigail George [facility]A Date: 12/28/2019   Infant is 84 hrs old. Mom says that her milk has come to volume. Mom was interested in the breastfeeding safety for sertraline & sumatriptan. I validated that sertraline is considered safe for breastfeeding.  Mom had thought sumatriptan had not been approved, but I showed Mom Abigail Fus George's "Medications & Mother's Milk" (2021) that sumatriptan "is the preferred triptan for use in lactation" and that it is "the best-studied drug in its class and is suitable for use in breastfeeding women" and that the amount of the drug expected to "reach infant's circulation is expected to be exceedingly low (less than 1%)" (p. 588).   Mom also takes hydrocodone for migraines. I let Mom know that it is recommended that she not exceed 30 mg/day of hydrocodone. Mom states that she typically only takes 1, maybe, 2 tablets/day.  Mom has no questions for me about breastfeeding. The parents are planning to reach out to Dr. Lexine Baton about infant's tongue-tie.   Mom donated 3000 oz with her last child. Mom had wondered if she should pump as much as she did with her last child to have such a great supply. I cautioned her against doing so, as she now has the potential to make even more milk and we won't want her to have "oversupply" to the degree that she is uncomfortable & seeking measures to wean/decrease supply.  Abigail George [facility] 12/28/2019, 11:08 AM

## 2019-12-28 NOTE — Discharge Summary (Signed)
OB Discharge Summary  Patient Name: Abigail George DOB: May 15, 1985 MRN: [id number]  Date of admission: 12/26/2019 Delivering provider: Olivia Mackie   Admitting diagnosis: Previous cesarean section [Z98.891] Previous cesarean delivery affecting pregnancy [O34.219] Intrauterine pregnancy: [redacted]w[redacted]d     Secondary diagnosis: Patient Active Problem List   Diagnosis Date Noted  . Status post repeat low transverse cesarean section 12/6 12/28/2019  . Depression/Anxiety 12/28/2019  . Postpartum care following cesarean delivery 12/6 05/08/2016    Date of discharge: 12/28/2019   Discharge diagnosis: Principal Problem:   Postpartum care following cesarean delivery 12/6 Active Problems:   Status post repeat low transverse cesarean section 12/6   Depression/Anxiety                                                              Post partum procedures:None  Augmentation: N/A Pain control: Spinal  Laceration:None  Episiotomy:None  Complications: None  Hospital course:  Scheduled C/S   34 y.o. yo [id number] at [redacted]w[redacted]d was admitted to the hospital 12/26/2019 for scheduled cesarean section with the following indication:Elective Repeat.Delivery details are as follows:  Membrane Rupture Time/Date: 10:02 AM ,12/26/2019   Delivery Method:C-Section, Low Transverse  Details of operation can be found in separate operative note.  Patient had an uncomplicated postpartum course.  She is ambulating, tolerating a regular diet, passing flatus, and urinating well. Patient is discharged home in stable condition on  12/28/19        Newborn Data: Birth date:12/26/2019  Birth time:10:03 AM  Gender:Female  Living status:Living  Apgars:8 ,8  KXFGHW:2993 g     Physical exam  Vitals:   12/27/19 0601 12/27/19 1937 12/28/19 0050 12/28/19 0518  BP:  125/65 119/67 118/74  Pulse:  (!) 57  63  Resp:  17 17 16   Temp: 97.9 F (36.6 C) 98.2 F (36.8 C) 98.1 F (36.7 C)   TempSrc: Axillary Axillary Oral   SpO2:  100% 98%  97%  Weight:      Height:       General: alert, cooperative and no distress Lochia: appropriate Uterine Fundus: firm Incision: Healing well with no significant drainage, will change dressing prior to discharge DVT Evaluation: No evidence of DVT seen on physical exam. No significant calf/ankle edema. Labs: Lab Results  Component Value Date   WBC 9.9 12/27/2019   HGB 11.3 (L) 12/27/2019   HCT 33.5 (L) 12/27/2019   MCV 85.5 12/27/2019   PLT 206 12/27/2019   CMP Latest Ref Rng & Units 09/02/2019  Glucose 70 - 99 mg/dL 99  BUN 6 - 23 mg/dL 7  Creatinine 09/04/2019 - 7.16 mg/dL 9.67  Sodium 8.93 - 810 mEq/L 135  Potassium 3.5 - 5.1 mEq/L 3.6  Chloride 96 - 112 mEq/L 103  CO2 19 - 32 mEq/L 23  Calcium 8.4 - 10.5 mg/dL 9.1  Total Protein 6.0 - 8.3 g/dL 6.3  Total Bilirubin 0.2 - 1.2 mg/dL 0.3  Alkaline Phos 39 - 117 U/L 45  AST 0 - 37 U/L 22  ALT 0 - 35 U/L 29   Edinburgh Postnatal Depression Scale Screening Tool 12/28/2019  I have been able to laugh and see the funny side of things. 0  I have looked forward with enjoyment to things. 0  I have blamed myself unnecessarily when things  went wrong. 0  I have been anxious or worried for no good reason. 1  I have felt scared or panicky for no good reason. 0  Things have been getting on top of me. 0  I have been so unhappy that I have had difficulty sleeping. 0  I have felt sad or miserable. 0  I have been so unhappy that I have been crying. 0  The thought of harming myself has occurred to me. 0  Edinburgh Postnatal Depression Scale Total 1    Vaccines: TDaP UTD         Flu    UTD         COVID-19   UTD  Discharge instructions:  per After Visit Summary and [facility] booklet  After Visit Meds:  Allergies as of 12/28/2019      Reactions   Amoxapine And Related Rash   Amoxicillin Hives, Other (See Comments)   Has patient had a PCN reaction causing immediate rash, facial/tongue/throat swelling, SOB or lightheadedness with  hypotension: No Has patient had a PCN reaction causing severe rash involving mucus membranes or skin necrosis: No Has patient had a PCN reaction that required hospitalization No Has patient had a PCN reaction occurring within the last 10 years: Yes If all of the above answers are "NO", then may proceed with Cephalosporin use. Lip swelling   Percocet [oxycodone-acetaminophen] Rash      Medication List    STOP taking these medications   acetaminophen 500 MG tablet Commonly known as: TYLENOL   esomeprazole 20 MG capsule Commonly known as: NEXIUM   sodium chloride 0.65 % Soln nasal spray Commonly known as: OCEAN   sulfamethoxazole-trimethoprim 800-160 MG tablet Commonly known as: BACTRIM DS   SUMAtriptan 25 MG tablet Commonly known as: IMITREX     TAKE these medications   coconut oil Oil Apply 1 application topically as needed.   HYDROcodone-acetaminophen 5-325 MG tablet Commonly known as: NORCO/VICODIN Take 1-2 tablets by mouth every 6 (six) hours as needed for severe pain. What changed:   how much to take  when to take this  reasons to take this   ibuprofen 800 MG tablet Commonly known as: ADVIL Take 1 tablet (800 mg total) by mouth every 6 (six) hours.   prenatal multivitamin Tabs tablet Take 3 tablets by mouth daily.   sertraline 25 MG tablet Commonly known as: ZOLOFT Take 25 mg by mouth daily.      Diet: routine diet  Activity: Advance as tolerated. Pelvic rest for 6 weeks.   Newborn Data: Live born female  Birth Weight: 10 lb 1.4 oz (4576 g) APGAR: 8, 8  Newborn Delivery   Birth date/time: 12/26/2019 10:03:00 Delivery type: C-Section, Low Transverse Trial of labor: No C-section categorization: Repeat     Named Hart [place] Baby Feeding: Breast Disposition:home with mother  Delivery Report:  Review the Delivery Report for details.    Follow up:  Follow-up Information    Olivia Mackie, MD. Schedule an appointment as soon as possible for a  visit in 6 week(s).   Specialty: Obstetrics and Gynecology Why: Please make an appointment for 6 weeks postpartum. Contact information: [address] [place] [address] [phone]               Clancy Gourd, MSN 12/28/2019, 11:16 AM

## 2020-01-02 ENCOUNTER — Inpatient Hospital Stay (HOSPITAL_COMMUNITY): Admission: AD | Admit: 2020-01-02 | Payer: BC Managed Care – PPO | Source: Home / Self Care

## 2020-05-02 ENCOUNTER — Other Ambulatory Visit: Payer: Self-pay

## 2020-05-02 ENCOUNTER — Emergency Department (HOSPITAL_BASED_OUTPATIENT_CLINIC_OR_DEPARTMENT_OTHER)
Admission: EM | Admit: 2020-05-02 | Discharge: 2020-05-02 | Disposition: A | Discharge status: Home / Self Care | Payer: BC Managed Care – PPO | Attending: Emergency Medicine | Admitting: Emergency Medicine

## 2020-05-02 ENCOUNTER — Encounter (HOSPITAL_BASED_OUTPATIENT_CLINIC_OR_DEPARTMENT_OTHER): Payer: Self-pay | Admitting: Emergency Medicine

## 2020-05-02 DIAGNOSIS — G43909 Migraine, unspecified, not intractable, without status migrainosus: Secondary | ICD-10-CM | POA: Insufficient documentation

## 2020-05-02 MED ORDER — LACTATED RINGERS IV BOLUS
1000.0000 mL | Freq: Once | INTRAVENOUS | Status: AC
  Administered 2020-05-02: 1000 mL via INTRAVENOUS

## 2020-05-02 MED ORDER — KETOROLAC TROMETHAMINE 30 MG/ML IJ SOLN
30.0000 mg | Freq: Once | INTRAMUSCULAR | Status: AC
  Administered 2020-05-02: 30 mg via INTRAVENOUS
  Filled 2020-05-02: qty 1

## 2020-05-02 MED ORDER — DIPHENHYDRAMINE HCL 50 MG/ML IJ SOLN
25.0000 mg | Freq: Once | INTRAMUSCULAR | Status: AC
  Administered 2020-05-02: 25 mg via INTRAVENOUS
  Filled 2020-05-02: qty 1

## 2020-05-02 MED ORDER — METOCLOPRAMIDE HCL 5 MG/ML IJ SOLN
10.0000 mg | Freq: Once | INTRAMUSCULAR | Status: AC
  Administered 2020-05-02: 10 mg via INTRAVENOUS
  Filled 2020-05-02: qty 2

## 2020-05-02 NOTE — ED Provider Notes (Signed)
[facility] EMERGENCY DEPT Provider Note   CSN: [id number] Arrival date & time: 05/02/20  1201     History Chief Complaint  Patient presents with  . Migraine    Abigail George is a 35 y.o. female.  HPI Patient reports a very long history of migraines.  She reports that she developed a migraine headache about 2 AM this morning.  Headache is intense pressure and pain behind her eyes and forehead.  She got very nauseated and had multiple episodes of vomiting.  She reports she gets light sensitivity and sound sensitivity.  No visual disturbances no weakness numbness or tingling of the extremities.  No recent fever chills or general illness.  She reports the last time she had to be treated in the emergency department for migraines about 9 months ago when she was pregnant.  She is breast-feeding a 40-month-old infant at this time.    Past Medical History:  Diagnosis Date  . Anemia   . Anxiety   . Cesarean delivery delivered 01/15/2020  . Depression   . Fibrocystic breast changes   . GERD (gastroesophageal reflux disease)   . Migraines 12/2007    Patient Active Problem List   Diagnosis Date Noted  . Status post repeat low transverse cesarean section 12/6 12/28/2019  . Depression/Anxiety 12/28/2019  . Postpartum care following cesarean delivery 12/6 05/08/2016    Past Surgical History:  Procedure Laterality Date  . CESAREAN SECTION N/A 05/08/2016   Procedure: CESAREAN SECTION;  Surgeon: Olivia Mackie, MD;  Location: [facility] BIRTHING SUITES;  Service: Obstetrics;  Laterality: N/A;  . CESAREAN SECTION N/A 12/26/2019   Procedure: Repeat CESAREAN SECTION;  Surgeon: Olivia Mackie, MD;  Location: [facility] LD ORS;  Service: Obstetrics;  Laterality: N/A;  EDD: 01/02/20  . IUD REMOVAL  05/16/14  . TONSILLECTOMY    . TYMPANOSTOMY TUBE PLACEMENT       OB History    Gravida  3   Para  2   Term  2   Preterm  0   AB  1   Living  2     SAB  1   IAB  0   Ectopic  0    Multiple  0   Live Births  2           Family History  Problem Relation Age of Onset  . Migraines Mother   . Alcohol abuse Mother   . Cerebral aneurysm Father   . Cerebral aneurysm Brother   . Tuberculosis Brother   . Crohn's disease Maternal Grandmother   . Cervical cancer Maternal Grandmother   . Ovarian cancer Paternal Grandmother     Social History   Tobacco Use  . Smoking status: Never Smoker  . Smokeless tobacco: Never Used  Vaping Use  . Vaping Use: Never used  Substance Use Topics  . Alcohol use: Not Currently    Alcohol/week: 0.0 standard drinks    Comment: less than 1 a week  . Drug use: No    Home Medications Prior to Admission medications   Medication Sig Start Date End Date Taking? Authorizing Provider  cetirizine (ZYRTEC) 10 MG tablet Take 10 mg by mouth daily.   Yes [provider]  coconut oil OIL Apply 1 application topically as needed. 12/28/19  Yes June Leap, CNM  HYDROcodone-acetaminophen (NORCO/VICODIN) 5-325 MG tablet Take 1-2 tablets by mouth every 6 (six) hours as needed for severe pain. 12/28/19  Yes June Leap, CNM  ibuprofen (ADVIL) 800  MG tablet Take 1 tablet (800 mg total) by mouth every 6 (six) hours. 12/28/19  Yes June Leap, CNM  Prenatal Vit-Fe Fumarate-FA (PRENATAL MULTIVITAMIN) TABS tablet Take 3 tablets by mouth daily.    Yes [provider]  sertraline (ZOLOFT) 25 MG tablet Take 25 mg by mouth daily. 07/22/19  Yes [provider]    Allergies    Amoxapine and related, Amoxicillin, and Percocet [oxycodone-acetaminophen]  Review of Systems   Review of Systems 10 systems reviewed and negative except as per HPI Physical Exam Updated Vital Signs BP 122/78 (BP Location: Right Arm)   Pulse 70   Temp 97.9 F (36.6 C) (Oral)   Resp 16   Ht 5\' 9"  (1.753 m)   Wt 82.6 kg   SpO2 98%   BMI 26.88 kg/m   Physical Exam Constitutional:      Appearance: She is well-developed.  HENT:      Head: Normocephalic and atraumatic.     Mouth/Throat:     Pharynx: Oropharynx is clear.  Cardiovascular:     Rate and Rhythm: Normal rate and regular rhythm.     Heart sounds: Normal heart sounds.  Pulmonary:     Effort: Pulmonary effort is normal.     Breath sounds: Normal breath sounds.  Abdominal:     General: Bowel sounds are normal. There is no distension.     Palpations: Abdomen is soft.     Tenderness: There is no abdominal tenderness.  Musculoskeletal:        General: Normal range of motion.     Cervical back: Neck supple.  Skin:    General: Skin is warm and dry.  Neurological:     General: No focal deficit present.     Mental Status: She is alert and oriented to person, place, and time.     GCS: GCS eye subscore is 4. GCS verbal subscore is 5. GCS motor subscore is 6.     Coordination: Coordination normal.     Comments: Cognitive function normal.  Speech clear.  Motor strength 5\5.  Normal heel shin exam.     ED Results / Procedures / Treatments   Labs (all labs ordered are listed, but only abnormal results are displayed) Labs Reviewed - No data to display  EKG None  Radiology No results found.  Procedures Procedures   Medications Ordered in ED Medications  diphenhydrAMINE (BENADRYL) injection 25 mg (25 mg Intravenous Given 05/02/20 1258)  ketorolac (TORADOL) 30 MG/ML injection 30 mg (30 mg Intravenous Given 05/02/20 1257)  metoCLOPramide (REGLAN) injection 10 mg (10 mg Intravenous Given 05/02/20 1258)  lactated ringers bolus 1,000 mL (1,000 mLs Intravenous New Bag/Given 05/02/20 1255)    ED Course  I have reviewed the triage vital signs and the nursing notes.  Pertinent labs & imaging results that were available during my care of the patient were reviewed by me and considered in my medical decision making (see chart for details).    MDM Rules/Calculators/A&P                          14: 18 recheck: Patient reports headache is resolved and she feels much  better.  Patient presents with long history of migraine headaches.  She reports this is typical headache.  No associated neurologic symptoms or symptoms infectious etiology.  Resolved with migraine cocktail.  Patient discharged in good condition.  Patient reports she has hydrocodone to take for headache while she  is breast-feeding.  She was concerned about possibility of Imitrex passing to breastmilk. Patient is advised to follow-up with neurology to discuss migraine management during breast-feeding as narcotics are suboptimal chronic migraine pain management strategy. Final Clinical Impression(s) / ED Diagnoses Final diagnoses:  Migraine without status migrainosus, not intractable, unspecified migraine type    Rx / DC Orders ED Discharge Orders    None       Arby Barrette, MD 05/02/20 1420

## 2020-05-02 NOTE — ED Triage Notes (Signed)
Pt stated that she has had a migraine headache along with nausea and vomiting.

## 2020-05-02 NOTE — Discharge Instructions (Signed)
1.  Discussed migraine management with your neurologist during breast-feeding.

## 2020-05-10 ENCOUNTER — Encounter (HOSPITAL_BASED_OUTPATIENT_CLINIC_OR_DEPARTMENT_OTHER): Payer: Self-pay | Admitting: Emergency Medicine

## 2020-05-10 ENCOUNTER — Other Ambulatory Visit: Payer: Self-pay

## 2020-05-10 ENCOUNTER — Emergency Department (HOSPITAL_BASED_OUTPATIENT_CLINIC_OR_DEPARTMENT_OTHER)
Admission: EM | Admit: 2020-05-10 | Discharge: 2020-05-10 | Disposition: A | Discharge status: Home / Self Care | Payer: BC Managed Care – PPO | Attending: Emergency Medicine | Admitting: Emergency Medicine

## 2020-05-10 DIAGNOSIS — G43909 Migraine, unspecified, not intractable, without status migrainosus: Secondary | ICD-10-CM | POA: Insufficient documentation

## 2020-05-10 DIAGNOSIS — R519 Headache, unspecified: Secondary | ICD-10-CM

## 2020-05-10 DIAGNOSIS — R112 Nausea with vomiting, unspecified: Secondary | ICD-10-CM

## 2020-05-10 MED ORDER — KETOROLAC TROMETHAMINE 30 MG/ML IJ SOLN
30.0000 mg | Freq: Once | INTRAMUSCULAR | Status: AC
  Administered 2020-05-10: 30 mg via INTRAVENOUS
  Filled 2020-05-10: qty 1

## 2020-05-10 MED ORDER — SUMATRIPTAN SUCCINATE 25 MG PO TABS: 25.0000 mg | ORAL_TABLET | ORAL | 0 refills | Status: DC | PRN

## 2020-05-10 MED ORDER — ONDANSETRON HCL 4 MG PO TABS: 4.0000 mg | ORAL_TABLET | Freq: Three times a day (TID) | ORAL | 0 refills | Status: DC | PRN

## 2020-05-10 MED ORDER — METOCLOPRAMIDE HCL 5 MG/ML IJ SOLN
10.0000 mg | Freq: Once | INTRAMUSCULAR | Status: AC
  Administered 2020-05-10: 10 mg via INTRAVENOUS
  Filled 2020-05-10: qty 2

## 2020-05-10 MED ORDER — DIPHENHYDRAMINE HCL 50 MG/ML IJ SOLN
25.0000 mg | Freq: Once | INTRAMUSCULAR | Status: AC
  Administered 2020-05-10: 25 mg via INTRAVENOUS
  Filled 2020-05-10: qty 1

## 2020-05-10 MED ORDER — SODIUM CHLORIDE 0.9 % IV BOLUS
1000.0000 mL | Freq: Once | INTRAVENOUS | Status: AC
  Administered 2020-05-10: 1000 mL via INTRAVENOUS

## 2020-05-10 NOTE — ED Provider Notes (Signed)
MEDCENTER [facility] EMERGENCY DEPT Provider Note   CSN: [id number] Arrival date & time: 05/10/20  1426     History Chief Complaint  Patient presents with  . Headache    Abigail George is a 35 y.o. female.  The history is provided by the patient and medical records. No language interpreter was used.  Migraine This is a recurrent problem. The current episode started 6 to 12 hours ago. The problem occurs constantly. The problem has not changed since onset.Associated symptoms include headaches. Pertinent negatives include no chest pain, no abdominal pain and no shortness of breath. Nothing aggravates the symptoms. Nothing relieves the symptoms. She has tried nothing for the symptoms. The treatment provided no relief.       Past Medical History:  Diagnosis Date  . Anemia   . Anxiety   . Cesarean delivery delivered 01/15/2020  . Depression   . Fibrocystic breast changes   . GERD (gastroesophageal reflux disease)   . Migraines 12/2007    Patient Active Problem List   Diagnosis Date Noted  . Status post repeat low transverse cesarean section 12/6 12/28/2019  . Depression/Anxiety 12/28/2019  . Postpartum care following cesarean delivery 12/6 05/08/2016    Past Surgical History:  Procedure Laterality Date  . CESAREAN SECTION N/A 05/08/2016   Procedure: CESAREAN SECTION;  Surgeon: Olivia Mackie, MD;  Location: [facility] BIRTHING SUITES;  Service: Obstetrics;  Laterality: N/A;  . CESAREAN SECTION N/A 12/26/2019   Procedure: Repeat CESAREAN SECTION;  Surgeon: Olivia Mackie, MD;  Location: [facility] LD ORS;  Service: Obstetrics;  Laterality: N/A;  EDD: 01/02/20  . IUD REMOVAL  05/16/14  . TONSILLECTOMY    . TYMPANOSTOMY TUBE PLACEMENT       OB History    Gravida  3   Para  2   Term  2   Preterm  0   AB  1   Living  2     SAB  1   IAB  0   Ectopic  0   Multiple  0   Live Births  2           Family History  Problem Relation Age of Onset  . Migraines  Mother   . Alcohol abuse Mother   . Cerebral aneurysm Father   . Cerebral aneurysm Brother   . Tuberculosis Brother   . Crohn's disease Maternal Grandmother   . Cervical cancer Maternal Grandmother   . Ovarian cancer Paternal Grandmother     Social History   Tobacco Use  . Smoking status: Never Smoker  . Smokeless tobacco: Never Used  Vaping Use  . Vaping Use: Never used  Substance Use Topics  . Alcohol use: Not Currently    Alcohol/week: 0.0 standard drinks    Comment: less than 1 a week  . Drug use: No    Home Medications Prior to Admission medications   Medication Sig Start Date End Date Taking? Authorizing Provider  cetirizine (ZYRTEC) 10 MG tablet Take 10 mg by mouth daily.    [provider]  coconut oil OIL Apply 1 application topically as needed. 12/28/19   June Leap, CNM  HYDROcodone-acetaminophen (NORCO/VICODIN) 5-325 MG tablet Take 1-2 tablets by mouth every 6 (six) hours as needed for severe pain. 12/28/19   June Leap, CNM  ibuprofen (ADVIL) 800 MG tablet Take 1 tablet (800 mg total) by mouth every 6 (six) hours. 12/28/19   June Leap, CNM  Prenatal Vit-Fe Fumarate-FA (PRENATAL MULTIVITAMIN) TABS tablet  Take 3 tablets by mouth daily.     [provider]  sertraline (ZOLOFT) 25 MG tablet Take 25 mg by mouth daily. 07/22/19   [provider]    Allergies    Amoxapine and related, Amoxicillin, and Percocet [oxycodone-acetaminophen]  Review of Systems   Review of Systems  Constitutional: Negative for chills, diaphoresis, fatigue and fever.  HENT: Negative for congestion.   Eyes: Positive for photophobia. Negative for visual disturbance.  Respiratory: Negative for chest tightness, shortness of breath and wheezing.   Cardiovascular: Negative for chest pain.  Gastrointestinal: Positive for nausea and vomiting. Negative for abdominal pain, constipation and diarrhea.  Genitourinary: Negative for dysuria and flank pain.   Musculoskeletal: Negative for back pain, neck pain and neck stiffness.  Neurological: Positive for headaches. Negative for dizziness, tremors, seizures, syncope, facial asymmetry, speech difficulty, weakness, light-headedness and numbness.  Psychiatric/Behavioral: Negative for agitation.  All other systems reviewed and are negative.   Physical Exam Updated Vital Signs BP 129/77 (BP Location: Right Arm)   Pulse 70   Temp 98.3 F (36.8 C) (Oral)   Resp 16   Ht 5\' 10"  (1.778 m)   Wt 83 kg   SpO2 98%   BMI 26.26 kg/m   Physical Exam Vitals and nursing note reviewed.  Constitutional:      General: She is not in acute distress.    Appearance: She is well-developed. She is not ill-appearing, toxic-appearing or diaphoretic.  HENT:     Head: Normocephalic and atraumatic.     Mouth/Throat:     Mouth: Mucous membranes are moist.  Eyes:     Extraocular Movements: Extraocular movements intact.     Conjunctiva/sclera: Conjunctivae normal.     Pupils: Pupils are equal, round, and reactive to light. Pupils are equal.  Cardiovascular:     Rate and Rhythm: Normal rate and regular rhythm.     Heart sounds: No murmur heard.   Pulmonary:     Effort: Pulmonary effort is normal. No respiratory distress.     Breath sounds: Normal breath sounds.  Abdominal:     Palpations: Abdomen is soft.     Tenderness: There is no abdominal tenderness.  Musculoskeletal:     Cervical back: Neck supple. No rigidity.  Skin:    General: Skin is warm and dry.     Capillary Refill: Capillary refill takes less than 2 seconds.  Neurological:     Mental Status: She is alert and oriented to person, place, and time. Mental status is at baseline.     GCS: GCS eye subscore is 4. GCS verbal subscore is 5. GCS motor subscore is 6.     Cranial Nerves: No dysarthria or facial asymmetry.     Sensory: No sensory deficit.     Motor: No weakness.  Psychiatric:        Mood and Affect: Mood normal.        Speech: Speech  normal.     ED Results / Procedures / Treatments   Labs (all labs ordered are listed, but only abnormal results are displayed) Labs Reviewed - No data to display  EKG None  Radiology No results found.  Procedures Procedures   Medications Ordered in ED Medications  metoCLOPramide (REGLAN) injection 10 mg (10 mg Intravenous Given 05/10/20 1529)  diphenhydrAMINE (BENADRYL) injection 25 mg (25 mg Intravenous Given 05/10/20 1530)  sodium chloride 0.9 % bolus 1,000 mL (1,000 mLs Intravenous New Bag/Given 05/10/20 1525)  ketorolac (TORADOL) 30 MG/ML injection 30 mg (  30 mg Intravenous Given 05/10/20 1528)    ED Course  I have reviewed the triage vital signs and the nursing notes.  Pertinent labs & imaging results that were available during my care of the patient were reviewed by me and considered in my medical decision making (see chart for details).    MDM Rules/Calculators/A&P                          Abigail George is a 35 y.o. female with a past medical history significant for chronic migraines, depression, anxiety, anemia, GERD, and currently breast-feeding her 7054-month-old baby who presents with severe headache.  Patient reports that last week she had a severe migraine as well with nausea and vomiting and had to come in for headache cocktail.  She reports she felt much better but then this morning woke up another episode.  She reports that she was having some construction work on her house and feels like she inhaled some smells and fumes from the construction and thinks that may have set it off.  She reports the headache is severe all across her head.  She denies any vision changes or speech difficulties.  Denies any neurologic deficits.  She reports the severe headache consistent with prior migraines with photophobia and phonophobia.  She has been having some dry heaving and nausea and vomiting and has not responded to her medications.  She is scheduled to follow-up with a  neurologist but was having too much pain to not evaluate today.  Of note, patient reports a family history of aneurysms however she reports that several years ago she had an MRA that was reassuring with no aneurysmal findings.  On exam, lungs are clear and chest is nontender.  Abdomen is nontender.  Patient had no focal neurologic deficits.  Pupils are symmetric reactive normal extract movements.  She is very photophobic.  No neck tenderness.  No nuchal rigidity.  Patient resting in discomfort in the dark.  Had a long shared decision made conversation with patient.  Patient is requesting the same medications that helped her headache during her previous visit.  Chart review shows that she had Toradol, fluids, Reglan, and Benadryl.  We will reorder these.  Patient requested that if she feels better she would like some to go home with, we will have a discussion about possible Reglan at discharge.  Anticipate reassessment after work-up.  Given lack of any new or other concerning findings, agree to hold on labs or any imaging at this time.  She denies any infectious symptoms.  5:27 PM Patient reports her headache is completely resolved.  She is feeling much better.  She would like to go home.  She is requesting a refill of her Imitrex at the dose she took during pregnancy as well as prescription for some nausea medicine.  This was felt to be reasonable.  Patient will follow up with her OB/GYN and PCP for further management.  She will follow-up with neurology as directed previously.  She had no other questions or concerns and was discharged in good condition with resolution of symptoms.      Final Clinical Impression(s) / ED Diagnoses Final diagnoses:  Migraine without status migrainosus, not intractable, unspecified migraine type  Bad headache  Non-intractable vomiting with nausea, unspecified vomiting type    Rx / DC Orders ED Discharge Orders         Ordered    SUMAtriptan (IMITREX) 25 MG tablet  Every 2 hours PRN        05/10/20 1730    ondansetron (ZOFRAN) 4 MG tablet  Every 8 hours PRN        05/10/20 1730          Clinical Impression: 1. Migraine without status migrainosus, not intractable, unspecified migraine type   2. Bad headache   3. Non-intractable vomiting with nausea, unspecified vomiting type     Disposition: Discharge  Condition: Good  I have discussed the results, Dx and Tx plan with the pt(& family if present). He/she/they expressed understanding and agree(s) with the plan. Discharge instructions discussed at great length. Strict return precautions discussed and pt &/or family have verbalized understanding of the instructions. No further questions at time of discharge.    New Prescriptions   ONDANSETRON (ZOFRAN) 4 MG TABLET    Take 1 tablet (4 mg total) by mouth every 8 (eight) hours as needed for nausea or vomiting.   SUMATRIPTAN (IMITREX) 25 MG TABLET    Take 1 tablet (25 mg total) by mouth every 2 (two) hours as needed for migraine (up to 2 doses a day). May repeat in 2 hours if headache persists or recurs.    Follow Up: Lewis Moccasin, MD [address] [place] [address] [phone]     your obgyn and neurologist     [facility] [address] [place] [address] [phone]       Ronon Ferger, Canary Brim, MD 05/10/20 [phone]

## 2020-05-10 NOTE — ED Triage Notes (Addendum)
Here for h/a that she has had that comes and goes states was seen here on the 13 for same  , cannot get into see neuro till may has had n/v

## 2020-05-10 NOTE — Discharge Instructions (Signed)
Your history and exam today are consistent with migrainous type headache similar to prior.  After your headache resolved, we feel you are safe for discharge home.  We discussed being careful with medications but as you have tolerated Imitrex during pregnancy and during breast-feeding.,  We refilled it for you.  Please also use the nausea medicine with care to help maintain your hydration.  Please call and follow-up with your OB/GYN as well as follow-up with your neurologist you are scheduled to see.  If any symptoms change or worsen acutely, please return to the nearest emergency department.

## 2020-10-01 ENCOUNTER — Other Ambulatory Visit: Payer: Self-pay | Admitting: Family Medicine

## 2020-10-01 DIAGNOSIS — R1031 Right lower quadrant pain: Secondary | ICD-10-CM

## 2020-10-04 ENCOUNTER — Ambulatory Visit
Admission: RE | Admit: 2020-10-04 | Discharge: 2020-10-04 | Disposition: A | Discharge status: Home / Self Care | Payer: BC Managed Care – PPO | Source: Ambulatory Visit | Attending: Family Medicine | Admitting: Family Medicine

## 2020-10-04 DIAGNOSIS — R1031 Right lower quadrant pain: Secondary | ICD-10-CM

## 2020-10-04 MED ORDER — IOPAMIDOL (ISOVUE-370) INJECTION 76%
80.0000 mL | Freq: Once | INTRAVENOUS | Status: AC | PRN
  Administered 2020-10-04: 80 mL via INTRAVENOUS

## 2021-02-02 ENCOUNTER — Other Ambulatory Visit: Payer: Self-pay | Admitting: Obstetrics and Gynecology

## 2021-02-02 DIAGNOSIS — R928 Other abnormal and inconclusive findings on diagnostic imaging of breast: Secondary | ICD-10-CM

## 2021-02-02 DIAGNOSIS — N6320 Unspecified lump in the left breast, unspecified quadrant: Secondary | ICD-10-CM

## 2021-02-11 ENCOUNTER — Other Ambulatory Visit: Payer: Self-pay | Admitting: Obstetrics and Gynecology

## 2021-02-11 DIAGNOSIS — N6320 Unspecified lump in the left breast, unspecified quadrant: Secondary | ICD-10-CM

## 2021-03-11 ENCOUNTER — Other Ambulatory Visit: Payer: BC Managed Care – PPO

## 2021-03-19 ENCOUNTER — Other Ambulatory Visit: Payer: Self-pay | Admitting: Surgery

## 2021-03-19 DIAGNOSIS — R928 Other abnormal and inconclusive findings on diagnostic imaging of breast: Secondary | ICD-10-CM

## 2021-03-29 ENCOUNTER — Other Ambulatory Visit: Payer: Self-pay | Admitting: Surgery

## 2021-03-29 ENCOUNTER — Ambulatory Visit
Admission: RE | Admit: 2021-03-29 | Discharge: 2021-03-29 | Disposition: A | Discharge status: Home / Self Care | Payer: BC Managed Care – PPO | Source: Ambulatory Visit | Attending: Surgery | Admitting: Surgery

## 2021-03-29 DIAGNOSIS — R928 Other abnormal and inconclusive findings on diagnostic imaging of breast: Secondary | ICD-10-CM

## 2021-04-16 ENCOUNTER — Ambulatory Visit: Payer: Self-pay | Admitting: Physician Assistant

## 2021-04-16 ENCOUNTER — Encounter: Payer: Self-pay | Admitting: Physician Assistant

## 2021-04-16 VITALS — BP 100/60 | HR 63 | Ht 65.0 in | Wt 187.0 lb

## 2021-04-16 DIAGNOSIS — R140 Abdominal distension (gaseous): Secondary | ICD-10-CM

## 2021-04-16 DIAGNOSIS — R109 Unspecified abdominal pain: Secondary | ICD-10-CM

## 2021-04-16 DIAGNOSIS — R195 Other fecal abnormalities: Secondary | ICD-10-CM

## 2021-04-16 MED ORDER — HYOSCYAMINE SULFATE 0.125 MG SL SUBL: SUBLINGUAL_TABLET | SUBLINGUAL | 2 refills | Status: DC

## 2021-04-16 NOTE — Progress Notes (Signed)
? ?Chief Complaint: Diarrhea, lower abdominal pain ? ?HPI: ?   Abigail George is a 36 year old Caucasian female with past medical history of anxiety, reflux and others listed below, known to Dr. Russella Dar, who presents clinic today with a complaint of diarrhea and lower abdominal pain. ?   08/25/2019 patient seen in clinic by Dr. Russella Dar for diarrhea that started about the 6 weeks of pregnancy.  That time discussed symptoms following acute gastrointestinal illness in late June.  She had labs including CBC, CMP, TSH, TTG, IgA and GI stool profile.  She was told to start Imodium 1-2 4 times daily as needed and a lactose-free diet.  Also started on Nexium 20 mg p.o. daily for pregnancy related reflux.  She was 21 weeks at that time. ?   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 twice daily x10 days. ?   Today, the patient tells me that about 2-1/2 weeks ago her daughter and her started with diarrhea.  She had her daughters feces cultured and it was negative.  Both of their symptoms have resolved since the weekend.  Patient tells me she kept the appointment because she has chronic abdominal discomfort.  Tells me she typically is bloated and feels like she has "more than normal loose stools", notes that oftentimes this happens in the morning.  Also describes lower abdominal discomfort and cramping prior to a bowel movement which is typically relieved afterwards.  Does relay that she is a very anxious and stressed person and she is on medication for it.  She has also been thinking about starting a probiotic and wanted a professional's opinion on which one to start. ?   Also tells me that she feels like her stomach twists sometimes when she is sitting and pulling something or in a certain position.  Apparently she has had this evaluated with imaging before and they did not see anything.  She tells me that it just occurs here and there and typically will last for  up to 30 minutes and then dissipate on its own.  It is always in the same spot just to the right of her umbilicus.  Tells me this happened yesterday and she is still sore from this episode.  Tells me she was told she does not have any abdominal hernias. ?   Relates history of having a 10 pound baby via C-section. ?   Denies fever, chills, weight loss or blood in her stool. ? ?Past Medical History:  ?Diagnosis Date  ? Anemia   ? Anxiety   ? Cesarean delivery delivered 01/15/2020  ? Depression   ? Fibrocystic breast changes   ? GERD (gastroesophageal reflux disease)   ? Migraines 12/2007  ? ? ?Past Surgical History:  ?Procedure Laterality Date  ? CESAREAN SECTION N/A 05/08/2016  ? Procedure: CESAREAN SECTION;  Surgeon: Olivia Mackie, MD;  Location: [facility] BIRTHING SUITES;  Service: Obstetrics;  Laterality: N/A;  ? CESAREAN SECTION N/A 12/26/2019  ? Procedure: Repeat CESAREAN SECTION;  Surgeon: Olivia Mackie, MD;  Location: [facility] LD ORS;  Service: Obstetrics;  Laterality: N/A;  EDD: 01/02/20  ? IUD REMOVAL  05/16/14  ? TONSILLECTOMY    ? TYMPANOSTOMY TUBE PLACEMENT    ? ? ?Current Outpatient Medications  ?Medication Sig Dispense Refill  ? cetirizine (ZYRTEC) 10 MG tablet Take 10 mg by mouth daily.    ? coconut oil OIL Apply 1 application topically as needed.  0  ? HYDROcodone-acetaminophen (NORCO/VICODIN)  5-325 MG tablet Take 1-2 tablets by mouth every 6 (six) hours as needed for severe pain. 30 tablet 0  ? ibuprofen (ADVIL) 800 MG tablet Take 1 tablet (800 mg total) by mouth every 6 (six) hours. 30 tablet 0  ? ondansetron (ZOFRAN) 4 MG tablet Take 1 tablet (4 mg total) by mouth every 8 (eight) hours as needed for nausea or vomiting. 12 tablet 0  ? Prenatal Vit-Fe Fumarate-FA (PRENATAL MULTIVITAMIN) TABS tablet Take 3 tablets by mouth daily.     ? sertraline (ZOLOFT) 25 MG tablet Take 25 mg by mouth daily.    ? SUMAtriptan (IMITREX) 25 MG tablet Take 1 tablet (25 mg total) by mouth every 2 (two) hours as needed for migraine  (up to 2 doses a day). May repeat in 2 hours if headache persists or recurs. 14 tablet 0  ? ?No current facility-administered medications for this visit.  ? ? ?Allergies as of 04/16/2021 - Review Complete 05/10/2020  ?Allergen Reaction Noted  ? Amoxapine and related Rash 12/29/2014  ? Amoxicillin Hives and Other (See Comments) 02/26/2015  ? Percocet [oxycodone-acetaminophen] Rash 06/26/2019  ? ? ?Family History  ?Problem Relation Age of Onset  ? Migraines Mother   ? Alcohol abuse Mother   ? Cerebral aneurysm Father   ? Cerebral aneurysm Brother   ? Tuberculosis Brother   ? Crohn's disease Maternal Grandmother   ? Cervical cancer Maternal Grandmother   ? Ovarian cancer Paternal Grandmother   ? ? ?Social History  ? ?Socioeconomic History  ? Marital status: Married  ?  Spouse name: Not on file  ? Number of children: Not on file  ? Years of education: Not on file  ? Highest education level: Not on file  ?Occupational History  ? Not on file  ?Tobacco Use  ? Smoking status: Never  ? Smokeless tobacco: Never  ?Vaping Use  ? Vaping Use: Never used  ?Substance and Sexual Activity  ? Alcohol use: Not Currently  ?  Alcohol/week: 0.0 standard drinks  ?  Comment: less than 1 a week  ? Drug use: No  ? Sexual activity: Yes  ?  Partners: Male  ?  Birth control/protection: None  ?Other Topics Concern  ? Not on file  ?Social History Narrative  ? Lives with husband and daughter  ? Caffeine use: Coffee daily  ? Soda daily  ? Right handed  ? ?Social Determinants of Health  ? ?Financial Resource Strain: Not on file  ?Food Insecurity: Not on file  ?Transportation Needs: Not on file  ?Physical Activity: Not on file  ?Stress: Not on file  ?Social Connections: Not on file  ?Intimate Partner Violence: Not on file  ? ? ?Review of Systems:    ?Constitutional: No weight loss, fever or chills ?Skin: No rash  ?Cardiovascular: No chest pain ?Respiratory: No SOB ?Gastrointestinal: See HPI and otherwise negative ?Genitourinary: No dysuria   ?Neurological: No headache, dizziness or syncope ?Musculoskeletal: No new muscle or joint pain ?Hematologic: No bleeding or bruising ?Psychiatric: +anxiety ? ? Physical Exam:  ?Vital signs: ?BP 100/60   Pulse 63   Ht 5\' 5"  (1.651 m)   Wt 187 lb (84.8 kg)   BMI 31.12 kg/m?   ? ?Constitutional:   Pleasant Caucasian female appears to be in NAD, Well developed, Well nourished, alert and cooperative ?Head:  Normocephalic and atraumatic. ?Eyes:   PEERL, EOMI. No icterus. Conjunctiva pink. ?Ears:  Normal auditory acuity. ?Neck:  Supple ?Throat: Oral cavity and pharynx without inflammation,  swelling or lesion.  ?Respiratory: Respirations even and unlabored. Lungs clear to auscultation bilaterally.   No wheezes, crackles, or rhonchi.  ?Cardiovascular: Normal S1, S2. No MRG. Regular rate and rhythm. No peripheral edema, cyanosis or pallor.  ?Gastrointestinal:  Soft, nondistended, nontender. No rebound or guarding. Normal bowel sounds. No appreciable masses or hepatomegaly. ?Rectal:  Not performed.  ?Msk:  Symmetrical without gross deformities. Without edema, no deformity or joint abnormality.  ?Neurologic:  Alert and  oriented x4;  grossly normal neurologically.  ?Skin:   Dry and intact without significant lesions or rashes. ?Psychiatric: Demonstrates good judgement and reason without abnormal affect or behaviors. ? ?No recent labs or imaging. ? ?Assessment: ?1.  Loose stools: Occasional loose stools in the morning, pending on what she eats with lower abdominal cramping for years as well as bloating, history of anxiety; likely IBS ?2.  Lower abdominal cramping: Prior to a bowel movement, relieved afterwards ; with IBS above  ?3.  "Stomach twisting": Describes a sensation of stomach twisting when she moves in certain positions, apparently had imaging of this area and they did not see anything, they ruled out hernia; consider possible intussusception or IBS ? ?Plan: ?1.  Discussed with patient that it sounds like she has  irritable bowel, especially with her history of anxiety and stress.  Discussed this in detail with her and answered questions. ?2.  Discussed the current debate about probiotics, but if she would like to try 1 then wo

## 2021-04-16 NOTE — Patient Instructions (Addendum)
If you are age 35 or older, your body mass index should be between 23-30. Your Body mass index is 31.12 kg/m?Marland Kitchen If this is out of the aforementioned range listed, please consider follow up with your Primary Care Provider. ? ?If you are age 64 or younger, your body mass index should be between 19-25. Your Body mass index is 31.12 kg/m?Marland Kitchen If this is out of the aformentioned range listed, please consider follow up with your Primary Care Provider.  ? ?________________________________________________________ ? ?The [place] [facility] providers would like to encourage you to use [facility] to communicate with providers for non-urgent requests or questions.  Due to long hold times on the telephone, sending your provider a message by [facility] may be a faster and more efficient way to get a response.  Please allow 48 business hours for a response.  Please remember that this is for non-urgent requests.  ?_______________________________________________________ ? ?Please start Align probiotic over the counter. ? ?We have sent the following medications to your pharmacy for you to pick up at your convenience: hyoscyamine - Use 1 SL tablet every 4-6 hours as needed for abdominal pain ? ?It was a pleasure to see you today! ? ?Thank you for trusting me with your gastrointestinal care!   ?  ? ? ?

## 2022-01-28 ENCOUNTER — Other Ambulatory Visit: Payer: Self-pay | Admitting: Obstetrics and Gynecology

## 2022-01-28 DIAGNOSIS — N6320 Unspecified lump in the left breast, unspecified quadrant: Secondary | ICD-10-CM

## 2022-04-30 ENCOUNTER — Ambulatory Visit (INDEPENDENT_AMBULATORY_CARE_PROVIDER_SITE_OTHER): Payer: Self-pay | Admitting: Neurology

## 2022-04-30 ENCOUNTER — Encounter: Payer: Self-pay | Admitting: Neurology

## 2022-04-30 ENCOUNTER — Other Ambulatory Visit: Payer: Self-pay | Admitting: Neurology

## 2022-04-30 VITALS — BP 125/67 | HR 66 | Ht 69.0 in | Wt 201.6 lb

## 2022-04-30 DIAGNOSIS — I639 Cerebral infarction, unspecified: Secondary | ICD-10-CM

## 2022-04-30 DIAGNOSIS — G43611 Persistent migraine aura with cerebral infarction, intractable, with status migrainosus: Secondary | ICD-10-CM

## 2022-04-30 NOTE — Progress Notes (Signed)
Patient left, had appt with [place] at 1030 and next appt was at [phone] and was not able to complete appt in time. We have left messages to reschedule.

## 2022-05-07 ENCOUNTER — Ambulatory Visit: Payer: BC Managed Care – PPO | Admitting: Neurology

## 2022-05-07 ENCOUNTER — Telehealth: Payer: Self-pay | Admitting: Neurology

## 2022-05-07 VITALS — BP 124/75 | HR 61 | Ht 69.0 in | Wt 199.8 lb

## 2022-05-07 DIAGNOSIS — Z8249 Family history of ischemic heart disease and other diseases of the circulatory system: Secondary | ICD-10-CM | POA: Diagnosis not present

## 2022-05-07 DIAGNOSIS — G43009 Migraine without aura, not intractable, without status migrainosus: Secondary | ICD-10-CM

## 2022-05-07 DIAGNOSIS — G43709 Chronic migraine without aura, not intractable, without status migrainosus: Secondary | ICD-10-CM

## 2022-05-07 DIAGNOSIS — R519 Headache, unspecified: Secondary | ICD-10-CM

## 2022-05-07 DIAGNOSIS — H93A9 Pulsatile tinnitus, unspecified ear: Secondary | ICD-10-CM

## 2022-05-07 MED ORDER — FREMANEZUMAB-VFRM 225 MG/1.5ML ~~LOC~~ SOSY: 225.0000 mg | PREFILLED_SYRINGE | Freq: Once | SUBCUTANEOUS | Status: DC

## 2022-05-07 MED ORDER — ZAVZPRET 10 MG/ACT NA SOLN: 1.0000 | Freq: Every day | NASAL | 0 refills | Status: DC | PRN

## 2022-05-07 MED ORDER — KETOROLAC TROMETHAMINE 60 MG/2ML IM SOLN
60.0000 mg | Freq: Once | INTRAMUSCULAR | Status: AC
Start: 2022-05-07 — End: 2022-05-07
  Administered 2022-05-07: 60 mg via INTRAMUSCULAR

## 2022-05-07 MED ORDER — ELYXYB 120 MG/4.8ML PO SOLN: 1.0000 | Freq: Every day | ORAL | 0 refills | Status: DC | PRN

## 2022-05-07 MED ORDER — NURTEC 75 MG PO TBDP: 75.0000 mg | ORAL_TABLET | Freq: Every day | ORAL | 0 refills | Status: DC | PRN

## 2022-05-07 MED ORDER — SUMATRIPTAN SUCCINATE 100 MG PO TABS: 100.0000 mg | ORAL_TABLET | Freq: Once | ORAL | 12 refills | Status: AC | PRN

## 2022-05-07 NOTE — Patient Instructions (Addendum)
Acute: Imitrex/bandryl - percocet only if inytractable and can save you from the ED Zavzpret once daily Nurtec once dail at onset or prior to when you think you may have a migraine bc of long half life. In the next 8 days can take it daily to bridge you. Elyxyb liquid strong ibuprofen  Preventatively: Ajovy (Emgality) then recommend Botox CTA Head: will order Inject Ajovy today  Ask about pump and dump at [facility] for CT contrast  Fremanezumab Injection What is this medication? FREMANEZUMAB (fre ma NEZ ue mab) prevents migraines. It works by blocking a substance in the body that causes migraines. It is a monoclonal antibody. This medicine may be used for other purposes; ask your health care provider or pharmacist if you have questions. COMMON BRAND NAME(S): AJOVY What should I tell my care team before I take this medication? They need to know if you have any of these conditions: An unusual or allergic reaction to fremanezumab, other medications, foods, dyes, or preservatives Pregnant or trying to get pregnant Breast-feeding How should I use this medication? This medication is injected under the skin. You will be taught how to prepare and give it. Take it as directed on the prescription label. Keep taking it unless your care team tells you to stop. It is important that you put your used needles and syringes in a special sharps container. Do not put them in a trash can. If you do not have a sharps container, call your pharmacist or care team to get one. Talk to your care team about the use of this medication in children. Special care may be needed. Overdosage: If you think you have taken too much of this medicine contact a poison control center or emergency room at once. NOTE: This medicine is only for you. Do not share this medicine with others. What if I miss a dose? If you miss a dose, take it as soon as you can. If it is almost time for your next dose, take only that dose. Do  not take double or extra doses. What may interact with this medication? Interactions are not expected. This list may not describe all possible interactions. Give your health care provider a list of all the medicines, herbs, non-prescription drugs, or dietary supplements you use. Also tell them if you smoke, drink alcohol, or use illegal drugs. Some items may interact with your medicine. What should I watch for while using this medication? Tell your care team if your symptoms do not start to get better or if they get worse. What side effects may I notice from receiving this medication? Side effects that you should report to your care team as soon as possible: Allergic reactions or angioedema--skin rash, itching or hives, swelling of the face, eyes, lips, tongue, arms, or legs, trouble swallowing or breathing Side effects that usually do not require medical attention (report to your care team if they continue or are bothersome): Pain, redness, or irritation at injection site This list may not describe all possible side effects. Call your doctor for medical advice about side effects. You may report side effects to FDA at 1-800-FDA-1088. Where should I keep my medication? Keep out of the reach of children and pets. Store in a refrigerator or at room temperature between 20 and 25 degrees C (68 and 77 degrees F). Refrigeration (preferred): Store in the refrigerator. Do not freeze. Keep in the original container until you are ready to take it. Remove the dose from the carton about 30  minutes before it is time for you to use it. If the dose is not used, it may be stored in the original container at room temperature for 7 days. Get rid of any unused medication after the expiration date. Room Temperature: This medication may be stored at room temperature for up to 7 days. Keep it in the original container. Protect from light until time of use. If it is stored at room temperature, get rid of any unused  medication after 7 days or after it expires, whichever is first. To get rid of medications that are no longer needed or have expired: Take the medication to a medication take-back program. Check with your pharmacy or law enforcement to find a location. If you cannot return the medication, ask your pharmacist or care team how to get rid of this medication safely. NOTE: This sheet is a summary. It may not cover all possible information. If you have questions about this medicine, talk to your doctor, pharmacist, or health care provider.  2023 Elsevier/Gold Standard (2021-02-26 00:00:00) Celecoxib Solution What is this medication? CELECOXIB (sell a KOX ib) treats mild to moderate pain, inflammation, or arthritis. It belongs to a group of medications called NSAIDs. This medicine may be used for other purposes; ask your health care provider or pharmacist if you have questions. COMMON BRAND NAME(S): Elyxyb What should I tell my care team before I take this medication? They need to know if you have any of these conditions: Cigarette smoker Coronary artery bypass graft (CABG) surgery within the past 2 weeks Drink more than 3 alcohol-containing drinks a day Heart disease High blood pressure History of stomach bleeding Kidney disease Liver disease Lung or breathing disease, like asthma An unusual or allergic reaction to celecoxib, sulfa medications, aspirin, other NSAIDs, other medications, foods, dyes, or preservatives Pregnant or trying to get pregnant Breast-feeding How should I use this medication? Take this medication by mouth. Follow the directions on the prescription label. Use a specially marked oral syringe, spoon, or dropper to measure each dose. Ask your pharmacist if you do not have one. Household spoons are not accurate. A special MedGuide will be given to you by the pharmacist with each prescription and refill. Be sure to read this information carefully each time. Talk to your care  team regarding the use of this medication in children. Special care may be needed. Overdosage: If you think you have taken too much of this medicine contact a poison control center or emergency room at once. NOTE: This medicine is only for you. Do not share this medicine with others. What if I miss a dose? This does not apply. This medication is not for regular use. What may interact with this medication? Do not take this medication with any of the following: Cidofovir Ketorolac Thioridazine This medication may also interact with the following: Alcohol Aspirin and aspirin-like medications Atomoxetine Certain medications for blood pressure, heart disease, irregular heart beat Certain medications for depression, anxiety, or psychotic disorders Certain medications that treat or prevent blood clots like warfarin, enoxaparin, dalteparin, apixaban, dabigatran, and rivaroxaban Cyclosporine Digoxin Diuretics Fluconazole Lithium Methotrexate Other NSAIDs, medications for pain and inflammation, like ibuprofen or naproxen Pemetrexed Rifampin Steroid medications like prednisone or cortisone This list may not describe all possible interactions. Give your health care provider a list of all the medicines, herbs, non-prescription drugs, or dietary supplements you use. Also tell them if you smoke, drink alcohol, or use illegal drugs. Some items may interact with your medicine. What should  I watch for while using this medication? Visit your care team for regular checks on your progress. Tell your care team if your symptoms do not start to get better or if they get worse. Do not take other medications that contain aspirin, ibuprofen, or naproxen with this medication. Side effects such as stomach upset, nausea, or ulcers may be more likely to occur. Many non-prescription medications contain aspirin, ibuprofen, or naproxen. Always read labels carefully. This medication can cause serious ulcers and bleeding  in the stomach. It can happen with no warning. Smoking, drinking alcohol, older age, and poor health can also increase risks. Call your care team right away if you have stomach pain or blood in your vomit or stool. This medication does not prevent a heart attack or stroke. This medication may increase the chance of a heart attack or stroke. The chance may increase the longer you use this medication or if you have heart disease. If you take aspirin to prevent a heart attack or stroke, talk to your care team about using this medication. Alcohol may interfere with the effect of this medication. Avoid alcoholic drinks. This medication may cause serious skin reactions. They can happen weeks to months after starting the medication. Contact your care team right away if you notice fevers or flu-like symptoms with a rash. The rash may be red or purple and then turn into blisters or peeling of the skin. Or, you might notice a red rash with swelling of the face, lips or lymph nodes in your neck or under your arms. Talk to your care team if you are pregnant before taking this medication. Taking this medication between weeks 20 and 30 of pregnancy may harm your unborn baby. Your care team will monitor you closely if you need to take it. After 30 weeks of pregnancy, do not take this medication. You may get drowsy or dizzy. Do not drive, use machinery, or do anything that needs mental alertness until you know how this medication affects you. Do not stand up or sit up quickly, especially if you are an older patient. This reduces the risk of dizzy or fainting spells. Be careful brushing or flossing your teeth or using a toothpick because you may get an infection or bleed more easily. If you have any dental work done, tell your dentist you are receiving this medication. This medication may make it more difficult to get pregnant. Talk to your care team if you are concerned about your fertility. What side effects may I notice  from receiving this medication? Side effects that you should report to your care team as soon as possible: Allergic reactions--skin rash, itching, hives, swelling of the face, lips, tongue, or throat Bleeding--bloody or black, tar-like stools, vomiting blood or brown material that looks like coffee grounds, red or dark brown urine, small red or purple spots on skin, unusual bruising or bleeding Heart attack--pain or tightness in the chest, shoulders, arms, or jaw, nausea, shortness of breath, cold or clammy skin, feeling faint or lightheaded Heart failure--shortness of breath, swelling of ankles, feet, or hands, sudden weight gain, unusual weakness or fatigue Increase in blood pressure Kidney injury--decrease in the amount of urine, swelling of the ankles, hands, or feet Liver injury--right upper belly pain, loss of appetite, nausea, light-colored stool, dark yellow or brown urine, yellowing skin or eyes, unusual weakness or fatigue Rash, fever, and swollen lymph nodes Redness, blistering, peeling, or loosening of the skin, including inside the mouth Stroke--sudden numbness or weakness of  the face, arm, or leg, trouble speaking, confusion, trouble walking, loss of balance or coordination, dizziness, severe headache, change in vision Side effects that usually do not require medical attention (report to your care team if they continue or are bothersome): Change in taste Headache Loss of appetite Nausea Upset stomach This list may not describe all possible side effects. Call your doctor for medical advice about side effects. You may report side effects to FDA at 1-800-FDA-1088. Where should I keep my medication? Keep out of the reach of children and pets. Store at room temperature between 15 and 30 degrees C (59 and 86 degrees F). Get rid of any unused medication after the expiration date. To get rid of medications that are no longer needed or have expired: Take the medication to a medication  take-back program. Check with your pharmacy or law enforcement to find a location. If you cannot return the medication, check the label or package insert to see if the medication should be thrown out in the garbage or flushed down the toilet. If you are not sure, ask your care team. If it is safe to put it in the trash, empty the medication out of the container. Mix the medication with cat litter, dirt, coffee grounds, or other unwanted substance. Seal the mixture in a bag or container. Put it in the trash. NOTE: This sheet is a summary. It may not cover all possible information. If you have questions about this medicine, talk to your doctor, pharmacist, or health care provider.  2023 Elsevier/Gold Standard (2020-01-12 00:00:00) Rimegepant Disintegrating Tablets What is this medication? RIMEGEPANT (ri ME je pant) prevents and treats migraines. It works by blocking a substance in the body that causes migraines. This medicine may be used for other purposes; ask your health care provider or pharmacist if you have questions. COMMON BRAND NAME(S): NURTEC ODT What should I tell my care team before I take this medication? They need to know if you have any of these conditions: Kidney disease Liver disease An unusual or allergic reaction to rimegepant, other medications, foods, dyes, or preservatives Pregnant or trying to get pregnant Breast-feeding How should I use this medication? Take this medication by mouth. Take it as directed on the prescription label. Leave the tablet in the sealed pack until you are ready to take it. With dry hands, open the pack and gently remove the tablet. If the tablet breaks or crumbles, throw it away. Use a new tablet. Place the tablet in the mouth and allow it to dissolve. Then, swallow it. Do not cut, crush, or chew this medication. You do not need water to take this medication. Talk to your care team about the use of this medication in children. Special care may be  needed. Overdosage: If you think you have taken too much of this medicine contact a poison control center or emergency room at once. NOTE: This medicine is only for you. Do not share this medicine with others. What if I miss a dose? This does not apply. This medication is not for regular use. What may interact with this medication? Certain medications for fungal infections, such as fluconazole, itraconazole Rifampin This list may not describe all possible interactions. Give your health care provider a list of all the medicines, herbs, non-prescription drugs, or dietary supplements you use. Also tell them if you smoke, drink alcohol, or use illegal drugs. Some items may interact with your medicine. What should I watch for while using this medication? Visit your care  team for regular checks on your progress. Tell your care team if your symptoms do not start to get better or if they get worse. What side effects may I notice from receiving this medication? Side effects that you should report to your care team as soon as possible: Allergic reactions--skin rash, itching, hives, swelling of the face, lips, tongue, or throat Side effects that usually do not require medical attention (report to your care team if they continue or are bothersome): Nausea Stomach pain This list may not describe all possible side effects. Call your doctor for medical advice about side effects. You may report side effects to FDA at 1-800-FDA-1088. Where should I keep my medication? Keep out of the reach of children and pets. Store at room temperature between 20 and 25 degrees C (68 and 77 degrees F). Get rid of any unused medication after the expiration date. To get rid of medications that are no longer needed or have expired: Take the medication to a medication take-back program. Check with your pharmacy or law enforcement to find a location. If you cannot return the medication, check the label or package insert to see if  the medication should be thrown out in the garbage or flushed down the toilet. If you are not sure, ask your care team. If it is safe to put it in the trash, take the medication out of the container. Mix the medication with cat litter, dirt, coffee grounds, or other unwanted substance. Seal the mixture in a bag or container. Put it in the trash. NOTE: This sheet is a summary. It may not cover all possible information. If you have questions about this medicine, talk to your doctor, pharmacist, or health care provider.  2023 Elsevier/Gold Standard (2021-02-27 00:00:00) Zavegepant Nasal Spray What is this medication? ZAVEGEPANT (za VE je pant) treats migraines. It works by blocking a substance in the body that causes migraines. It is not used to prevent migraines. This medicine may be used for other purposes; ask your health care provider or pharmacist if you have questions. COMMON BRAND NAME(S): ZAVZPRET What should I tell my care team before I take this medication? They need to know if you have any of these conditions: Kidney disease Liver disease An unusual or allergic reaction to zavegepant, other medications, foods, dyes, or preservatives Pregnant or trying to get pregnant Breast-feeding How should I use this medication? This medication is for use in the nose. Take it as directed on the prescription label. Do not use it more often than directed. Make sure that you are using your nasal spray correctly. Ask your care team if you have any questions. Talk to your care team about the use of this medication in children. Special care may be needed. Overdosage: If you think you have taken too much of this medicine contact a poison control center or emergency room at once. NOTE: This medicine is only for you. Do not share this medicine with others. What if I miss a dose? This does not apply. This medication is not for regular use. It should only be used as needed. What may interact with this  medication? Decongestant nasal sprays This medication may affect how other medications work, and other medications may affect the way this medication works. Talk with your care team about all of the medications you take. They may suggest changes to your treatment plan to lower the risk of side effects and to make sure your medications work as intended. This list may not describe  all possible interactions. Give your health care provider a list of all the medicines, herbs, non-prescription drugs, or dietary supplements you use. Also tell them if you smoke, drink alcohol, or use illegal drugs. Some items may interact with your medicine. What should I watch for while using this medication? Visit your care team for regular checks on your progress. Tell your care team if your symptoms do not start to get better or if they get worse. What side effects may I notice from receiving this medication? Side effects that you should report to your care team as soon as possible: Allergic reactions--skin rash, itching, hives, swelling of the face, lips, tongue, or throat Side effects that usually do not require medical attention (report to your care team if they continue or are bothersome): Change in taste Dryness or irritation inside the nose Nausea Vomiting This list may not describe all possible side effects. Call your doctor for medical advice about side effects. You may report side effects to FDA at 1-800-FDA-1088. Where should I keep my medication? Keep out of the reach of children and pets. Store at room temperature between 20 and 25 degrees C (68 and 77 degrees F). Do not freeze. Get rid of any unused medication after the expiration date. To get rid of medications that are no longer needed or have expired: Take the medication to a medication take-back program. Check with your pharmacy or law enforcement to find a location. If you cannot return the medication, ask your pharmacist or care team how to get rid  of this medication safely. NOTE: This sheet is a summary. It may not cover all possible information. If you have questions about this medicine, talk to your doctor, pharmacist, or health care provider.  2023 Elsevier/Gold Standard (2021-04-03 00:00:00) Acetaminophen; Oxycodone Tablets What is this medication? ACETAMINOPHEN; OXYCODONE (a set a MEE noe fen; ox i KOE done) treats moderate pain. It is prescribed when other pain medications have not worked or cannot be tolerated. It works by blocking pain signals in the brain. This medication is a combination of acetaminophen and an opioid. This medicine may be used for other purposes; ask your health care provider or pharmacist if you have questions. COMMON BRAND NAME(S): Endocet, Magnacet, Nalocet, Narvox, Percocet, Perloxx, Primalev, Primlev, Prolate, Roxicet, Tylox, Xolox What should I tell my care team before I take this medication? They need to know if you have any of these conditions: Brain tumor Drug abuse or addiction Head injury Heart disease If you often drink alcohol Kidney disease Liver disease Low adrenal gland function Lung disease, asthma, or breathing problem Seizures Stomach or intestine problems Taken an MAOI like Marplan, Nardil, or Parnate in the last 14 days An unusual or allergic reaction to acetaminophen, oxycodone, other medications, foods, dyes or preservatives Pregnant or trying to get pregnant Breast-feeding How should I use this medication? Take this medicine by mouth with a full glass of water. Take it as directed on the label. You can take it with or without food. If it upsets your stomach, take it with food. Do not use it more often than directed. There may be unused or extra doses in the bottle after you finish your treatment. Talk to your health care provider if you have questions about your dose. A special MedGuide will be given to you by the pharmacist with each prescription and refill. Be sure to read this  information carefully each time. Talk to your care team about the use of this medication in children.  Special care may be needed. Patients over 33 years of age may have a stronger reaction and need a smaller dose. Overdosage: If you think you have taken too much of this medicine contact a poison control center or emergency room at once. NOTE: This medicine is only for you. Do not share this medicine with others. What if I miss a dose? If you miss a dose, take it as soon as you remember. Then, take your next dose 12 hours later. Do not take double or extra doses. What may interact with this medication? This medication may interact with the following: Alcohol Antihistamines for allergy, cough and cold Antiviral medications for HIV or AIDS Atropine Certain antibiotics like clarithromycin, erythromycin, linezolid, rifampin Certain medications for anxiety or sleep Certain medications for bladder problems like oxybutynin, tolterodine Certain medications for depression like amitriptyline, fluoxetine, sertraline Certain medications for fungal infections like ketoconazole, itraconazole, voriconazole Certain medications for migraine headache like almotriptan, eletriptan, frovatriptan, naratriptan, rizatriptan, sumatriptan, zolmitriptan Certain medications for nausea or vomiting like dolasetron, ondansetron, palonosetron Certain medications for Parkinson's disease like benztropine, trihexyphenidyl Certain medications for seizures like phenobarbital, phenytoin, primidone Certain medications for stomach problems like dicyclomine, hyoscyamine Certain medications for travel sickness like scopolamine Diuretics General anesthetics like halothane, isoflurane, methoxyflurane, propofol Ipratropium Local anesthetics like lidocaine, pramoxine, tetracaine MAOIs like Carbex, Eldepryl, Marplan, Nardil, and Parnate Medications that relax muscles for surgery Methylene blue Nilotinib Other medications with  acetaminophen Other narcotic medications for pain or cough Phenothiazines like chlorpromazine, mesoridazine, prochlorperazine, thioridazine This list may not describe all possible interactions. Give your health care provider a list of all the medicines, herbs, non-prescription drugs, or dietary supplements you use. Also tell them if you smoke, drink alcohol, or use illegal drugs. Some items may interact with your medicine. What should I watch for while using this medication? Tell your care team if your pain does not go away, if it gets worse, or if you have new or a different type of pain. You may develop tolerance to this medication. Tolerance means that you will need a higher dose of the medication for pain relief. Tolerance is normal and is expected if you take this medication for a long time. Taking this medication with other substances that cause drowsiness, such as alcohol, benzodiazepines, or other opioids can cause serious side effects. Give your care team a list of all medications you use. They will tell you how much medication to take. Do not take more medication than directed. Call emergency services if you have problems breathing or staying awake. Children may be at higher risk for side effects. Stop giving this medication and call emergency services right away if your child has slow or noisy breathing, has confusion, is unusually sleepy, or not able to wake up. Long term use of this medication may cause your brain and body to depend on it. This can happen even when used as directed by your care team. You and your care team will work together to determine how long you will need to take this medication. If your care team wants you to stop this medication, the dose will be slowly lowered over time to reduce the risk of side effects. Naloxone is an emergency medication used for an opioid overdose. An overdose can happen if you take too much of an opioid. It can also happen if an opioid is taken  with some other medications or substances such as alcohol. Know the symptoms of an overdose, such as trouble breathing, unusually tired or  sleepy, or not being able to respond or wake up. Make sure to tell caregivers and close contacts where your naloxone is stored. Make sure they know how to use it. After naloxone is given, the person giving it must call emergency services. Naloxone is a temporary treatment. Repeat doses may be needed. This medication may affect your coordination, reaction time, or judgment. Do not drive or operate machinery until you know how this medication affects you. Sit up or stand slowly to reduce the risk of dizzy or fainting spells. Drinking alcohol with this medication can increase the risk of these side effects. Do not take other medications that contain acetaminophen with this medication. Many non-prescription medications contain acetaminophen. Always read labels carefully. If you have questions, ask your care team. If you take too much acetaminophen, get medical help right away. Too much acetaminophen can be very dangerous and cause liver damage. Even if you do not have symptoms, it is important to get help right away. This medication will cause constipation. If you do not have a bowel movement for 3 days, call your care team. Your mouth may get dry. Chewing sugarless gum or sucking hard candy and drinking plenty of water may help. Contact your care team if the problem does not go away or is severe. Talk to your care team if you are pregnant or think you might be pregnant. This medication can cause serious birth defects if taken during pregnancy. Prolonged use of this medication during pregnancy can cause withdrawal in a newborn. Talk to your care team before breastfeeding. Changes to your treatment plan may be needed. If you breastfeed while taking this medication, seek medical care right away if you notice the child has slow or noisy breathing, is unusually sleepy or not able  to wake up, or is limp. Long-term use of this medication may cause infertility. Talk to your care team if you are concerned about your fertility. What side effects may I notice from receiving this medication? Side effects that you should report to your care team as soon as possible: Allergic reactions--skin rash, itching, hives, swelling of the face, lips, tongue, or throat CNS depression--slow or shallow breathing, shortness of breath, feeling faint, dizziness, confusion, trouble staying awake Liver injury--right upper belly pain, loss of appetite, nausea, light-colored stool, dark yellow or brown urine, yellowing skin or eyes, unusual weakness or fatigue Low adrenal gland function--nausea, vomiting, loss of appetite, unusual weakness or fatigue, dizziness Low blood pressure--dizziness, feeling faint or lightheaded, blurry vision Redness, blistering, peeling, or loosening of the skin, including inside the mouth Side effects that usually do not require medical attention (report to your care team if they continue or are bothersome): Constipation Dizziness Drowsiness Dry mouth Headache Nausea Trouble sleeping Upset stomach Vomiting This list may not describe all possible side effects. Call your doctor for medical advice about side effects. You may report side effects to FDA at 1-800-FDA-1088. Where should I keep my medication? Keep out of the reach of children and pets. This medication can be abused. Keep it in a safe place to protect it from theft. Do not share it with anyone. It is only for you. Selling or giving away this medication is dangerous and against the law. Store at room temperature between 20 and 25 degrees C (68 and 77 degrees F). Protect from light. Get rid of any unused medicine after the expiration date. This medication may cause harm and death if it is taken by other adults, children, or pets. It  is important to get rid of the medication as soon as you no longer need it, or it  is expired. You can do this in two ways: Take the medication to a medication take-back program. Check with your pharmacy or law enforcement to find a location. If you cannot return the medication, flush it down the toilet. NOTE: This sheet is a summary. It may not cover all possible information. If you have questions about this medicine, talk to your doctor, pharmacist, or health care provider.  2023 [employer] (2020-01-05 00:00:00)

## 2022-05-07 NOTE — Progress Notes (Signed)
Verbal order for toradol  IM per Dr.  Lucia Gaskins.  Under aseptic technique Toradol  IM to R upper outer gluteal quadrant.  Tolerated well.  Bandaid applied.

## 2022-05-07 NOTE — Telephone Encounter (Signed)
F/u video visit scheduled 09/01/22 for one hour per Dr. Lucia Gaskins.

## 2022-05-07 NOTE — Progress Notes (Addendum)
[facility]    Provider:  Dr Lucia Gaskins Requesting Provider: Lewis Moccasin, MD Primary Care Provider:  Lewis Moccasin, MD  CC:  migraines  Addendum 05/30/2022: Abigail George doing excellent now has <8 migraine days a month and < 12 total headache days a month will order nurtec.  HPI:  Abigail George is a 37 y.o. female here as requested by Lewis Moccasin, MD for migraines. has Postpartum care following cesarean delivery 12/6; Status post repeat low transverse cesarean section 12/6; Depression/Anxiety; and Chronic migraine without aura without status migrainosus, not intractable on their problem list.  I reviewed her notes from Dr. Terie Purser she also has depression, insomnia, ADHD, migraine, anemia, overweight, anxiety, plantar fasciitis syndrome, radial head fracture in addition to migraine.  She has a family history of migraines and her father died from a brain aneurysm at 76 and mother died from possible blood clots at 60 with rheumatoid arthritis.  No smoking no alcohol.  Looking through her list the medications that she is on that can help and migraines currently include diclofenac, Effexor, Zofran, propranolol, sumatriptan hand but she has been a patient of our practice in the past and I will review those notes below.  Dr. Carlos American exam was normal including neurologic exam.  They talked about headaches acute versus prophylaxis, massage for tension, stress management, triggers for prevention, is on Effexor and propranolol and still frequent (did not tolerate the propranolol ER), NSAIDs did nothing, sumatriptan works but taking too often.  I reviewed [facility].  She was also a patient of Dr. Juanita Laster who is not migraine doctor and currently retired but I do not have those notes.  She is a prior patient of Dr. 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  It appears as scrolling through the notes she was doing pretty well 1-2 migraine days per month during her pregnancy and lactation at least in 2019 as documented by multiple appointments by Ihor Austin, NP, she has had migraines as a child, since 23, when we saw her in 2018 she was having headaches on average 3 times a week until she began pregnant (80% of women do improve with her migraines during pregnancy) but prior to that she had chronic migraines at least 3 a week, stress and weather changes in perfumes may bring on headaches, headaches are all over the head with pressure sensations in the face and the neck area, photophobia phonophobia, some cognitive clouding associated.  She also saw Dr. Antonietta Jewel at [facility], in 2023 she was having at least 9 migraine days a month and greater than 21 headache days a month.  Dr. Antonietta Jewel did talk to her about lifestyle, headache diets, medication overuse.  Also asked her to maintain a headache diary, talked about triggers.  Also talked about headache prevention strategies.  Gave her multiple helpful websites.  It appears she goes back at least a decade of seeing neurologist for her migraines.  All notes I read all neurologic exams have always been completely normal.  Blood pressure has been on the lower side.  Patient is here  alone.  She has had migraines since the age of 29 or earlier.  Prior to being pregnant she had chronic migraines at least until she was pregnant and they improved in 2019, since then they have worsened, she has pulsating pounding and throbbing, can be unilateral all over the head, nausea, hurts to move, photophobia and phonophobia, cognitive clouding  associated with the migraine, triggers are stress and weather changes in perfumes.  She has tried multiple medications with average results, see below, currently she is on propranolol and Effexor and not doing extremely well.  Neurologic exams have always been normal.  It appears she was transferred here after her last neurologist retired, 2018.  She also saw Dr. Antonietta Jewel at the [facility] last in 2023, she could not increase her propranolol due to fatigue.  Her migraines can be severe moderate and mild, she has nausea and vomiting, in 2023 she had an acute onset of hearing loss and was seen by audiology and ENT. Use a form of birth control. Pregnancy was great with migraines except when she had Cyclospora cayetanensis and migraines worsened. Since 2022 she started having worsening migraines back to back awful migraines that sent her to the ED. Severe, had to go again, vomiting. She is still nursing. He is 2 years 4 months. Still breastfeeding more comfort, not for diet. 12 migraine days a month. At least 20 total headache days a month. Moderate to severe. No medication overuse. No aura. Ongoing for > 1 year at this freq, duration and severity. She takes benadryl, imitrex and zofran at onset. Occ when extremely severe has to take percocet but very responsible with that. FHx of aneuneurysm and pulsatile tinnitis. No other focal neurologic deficits, associated symptoms, inciting events or modifiable factors.    Reviewed notes, labs and imaging from outside physicians, which showed:  I do not see any recent CBCs, CMP, TSH in epic or "Care Everywhere", these to the last I reviewed:  TSH 08/2019: 1.28 nml     Latest Ref Rng & Units 12/27/2019    5:27 AM 12/23/2019    9:23 AM 08/25/2019    9:25 AM  CBC  WBC 4.0 - 10.5 K/uL 9.9  6.4  8.5   Hemoglobin 12.0 - 15.0 g/dL 27.2  53.6  64.4   Hematocrit 36.0 - 46.0 % 33.5  35.6  37.7   Platelets 150 - 400 K/uL 206  226  226.0        Latest Ref Rng & Units 09/02/2019    3:30 PM 08/25/2019    9:25 AM 01/07/2015    8:36 AM  CMP  Glucose 70 - 99 mg/dL 99  82  99   BUN 6 - 23 mg/dL 7  5  12    Creatinine 0.40 - 1.20 mg/dL 0.34  7.42  5.95   Sodium 135 - 145 mEq/L 135  135  139   Potassium 3.5 - 5.1 mEq/L 3.6  3.3  4.2   Chloride 96 - 112 mEq/L 103  104  104   CO2 19 - 32 mEq/L 23  24  27    Calcium 8.4 - 10.5 mg/dL 9.1  8.7  9.9   Total Protein 6.0 - 8.3 g/dL 6.3  6.4  7.7   Total Bilirubin 0.2 - 1.2 mg/dL 0.3  0.3  1.1   Alkaline Phos 39 - 117 U/L 45  44  45   AST 0 - 37 U/L 22  25  21    ALT 0 -  35 U/L 29  37  17      From a thorough review of records, medications tried that can be used in migraine management include: Propranolol IR, could not tolerate propranolol ER, NSAIDs, sumatriptan, Effexor, Zofran, diclofenac, Tylenol, Decadron injections, nortiptyline not tolerates, Benadryl capsules, Benadryl injections, hydrocodone, ibuprofen, ketorolac injections and oral, Methergine, Medrol Dosepak, Reglan tablets and injections, Zofran oral and injections, Phenergan oral and injections, scopolamine patches, Zoloft, Imitrex, rizatriptan, Effexor, topiramate and Zonegran.  Magnesium, B2, imitrex is effective, had a bad reaction to Ajovy, aimovig contraindicated due to constipation  02/08/2013: MRA head:  Narrative  CLINICAL DATA:  Headache.  Family history of cerebral aneurysm  EXAM: MRA HEAD WITHOUT CONTRAST  TECHNIQUE: Angiographic images of the Circle of Willis were obtained using MRA technique without intravenous contrast.  COMPARISON:  None.  FINDINGS: Both vertebral arteries are patent to the basilar. Posterior inferior cerebellar artery is patent bilaterally. The basilar is widely patent. Superior cerebellar and posterior cerebral arteries are patent bilaterally.  The cavernous carotid is widely patent bilaterally. Anterior and middle cerebral arteries are widely patent without significant stenosis or  occlusion.  Negative for cerebral aneurysm or vascular malformation.  IMPRESSION: Normal    MR Temporal bones 02/20/2021: CLINICAL DATA:  Hearing loss in left ear, left-sided tinnitus   EXAM:  MRI HEAD WITHOUT AND WITH CONTRAST   TECHNIQUE:  Multiplanar, multiecho pulse sequences of the brain and surrounding  structures were obtained without and with intravenous contrast.   CONTRAST:  8.5 mL Gadavist   COMPARISON:  No prior MRI of the head, correlation is made with MRA  head 06/17/2017   FINDINGS:  Brain: No restricted diffusion to suggest acute or subacute infarct.  No acute hemorrhage, mass, mass effect, or midline shift. No foci of  hemosiderin deposition to suggest remote hemorrhage. No abnormal  parenchymal or meningeal enhancement.   Cranial Nerves VII and VIII: Normal bilaterally, without evidence of  mass or abnormal enhancement.   Cochleae: Normal bilaterally.   Semicircular Canals: Normal bilaterally.   Porus Acusticus: Normal bilaterally.   Cerebellopontine Angle: Normal bilaterally, without evidence of  mass.   Vestibular Aqueducts: Normal bilaterally.   Vascular: Normal flow voids.   Skull and upper cervical spine: Normal marrow signal.   Sinuses/Orbits: No acute finding.  The orbits are unremarkable.   Other: The mastoids are well aerated.   IMPRESSION:  1. No acute intracranial process.  2. No abnormality is seen in the bilateral internal auditory canals  to explain the patient's hearing loss.   Review of Systems: Patient complains of symptoms per HPI as well as the following symptoms migraines. Pertinent negatives and positives per HPI. All others negative.   Social History   Socioeconomic History   Marital status: Married    Spouse name: Not on file   Number of children: 2   Years of education: Not on file   Highest education level: Not on file  Occupational History   Occupation: Homemaker  Tobacco Use   Smoking status: Never    Smokeless tobacco: Never  Vaping Use   Vaping Use: Never used  Substance and Sexual Activity   Alcohol use: Not Currently    Alcohol/week: 0.0 standard drinks of alcohol    Comment: less than 1 a week   Drug use: No   Sexual activity: Yes    Partners: Male    Birth control/protection: None  Other Topics Concern   Not on file  Social History Narrative   Lives  with husband and daughter, son.  Homemaker   Caffeine use: Coffee daily 1-2 servings   Soda daily   Right handed   Social Determinants of Health   Financial Resource Strain: Not on file  Food Insecurity: Not on file  Transportation Needs: Not on file  Physical Activity: Not on file  Stress: Not on file  Social Connections: Not on file  Intimate Partner Violence: Not on file    Family History  Problem Relation Age of Onset   Migraines Mother    Alcohol abuse Mother    Cerebral aneurysm Father    Migraines Brother    Cerebral aneurysm Brother    Tuberculosis Brother    Crohn's disease Maternal Grandmother    Cervical cancer Maternal Grandmother    Tongue cancer Maternal Grandfather    Ovarian cancer Paternal Grandmother    Colon cancer Neg Hx     Past Medical History:  Diagnosis Date   Anemia    Anxiety    Cesarean delivery delivered 01/15/2020   Depression    Fibrocystic breast changes    GERD (gastroesophageal reflux disease)    Migraines 12/2007    Patient Active Problem List   Diagnosis Date Noted   Chronic migraine without aura without status migrainosus, not intractable 05/10/2022   Status post repeat low transverse cesarean section 12/6 12/28/2019   Depression/Anxiety 12/28/2019   Postpartum care following cesarean delivery 12/6 05/08/2016    Past Surgical History:  Procedure Laterality Date   BREAST BIOPSY Left    benign   CESAREAN SECTION N/A 05/08/2016   Procedure: CESAREAN SECTION;  Surgeon: Olivia Mackie, MD;  Location: [facility];  Service: Obstetrics;  Laterality: N/A;    CESAREAN SECTION N/A 12/26/2019   Procedure: Repeat CESAREAN SECTION;  Surgeon: Olivia Mackie, MD;  Location: [facility] LD ORS;  Service: Obstetrics;  Laterality: N/A;  EDD: 01/02/20   IUD REMOVAL  05/16/2014   TONSILLECTOMY     TYMPANOSTOMY TUBE PLACEMENT      Current Outpatient Medications  Medication Sig Dispense Refill   ALPRAZolam (XANAX) 0.25 MG tablet Take 0.25 mg by mouth 3 (three) times daily as needed.     betamethasone dipropionate 0.05 % lotion Apply topically daily.     Calcipotriene 0.005 % solution Apply topically.     Celecoxib (ELYXYB) 120 MG/4.8ML SOLN Take 1 Dose by mouth daily as needed. 4.8 mL 0   D3 HIGH POTENCY 125 MCG (5000 UT) capsule Take 5,000 Units by mouth daily.     Fremanezumab-vfrm (AJOVY) 225 MG/1.5ML SOAJ Inject 225 mg into the skin every 30 (thirty) days. BIN#: [id number] PCN#: PDMI GROUP#: [id number] ID#: [id number] EXPIRES: 01/20/2023 1.5 mL 11   HYDROcodone-acetaminophen (NORCO/VICODIN) 5-325 MG tablet Take 1 tablet by mouth every 6 (six) hours as needed for moderate pain. Patient states taken in the past without a problem please have pharmacist review with her says reaction: rash 30 tablet 0   hyoscyamine (LEVSIN SL) 0.125 MG SL tablet Use 1 SL tablet every 4-6 hours as needed for abdominal pain 15 tablet 2   Methylphenidate HCl ER, XR, 15 MG CP24 TAKE 1 CAPSULE BY MOUTH DAILY IN THE MORNING Oral for 30 Days     ondansetron (ZOFRAN) 4 MG tablet Take 1 tablet by mouth 3 times daily as needed for nausea and vomiting with migraines     Prenatal Vit-Fe Fumarate-FA (PRENATAL MULTIVITAMIN) TABS tablet Take 3 tablets by mouth daily.      propranolol (INDERAL) 40 MG tablet Take  40 mg by mouth at bedtime.     Rimegepant Sulfate (NURTEC) 75 MG TBDP Take 1 tablet (75 mg total) by mouth daily as needed. Take one daily for 8 days. Then as needed as discussed. 16 tablet 0   SUMAtriptan (IMITREX) 100 MG tablet Take 1 tablet (100 mg total) by mouth once as needed for up to 1 dose.  May repeat in 2 hours if headache persists or recurs. 12 tablet 12   venlafaxine XR (EFFEXOR-XR) 37.5 MG 24 hr capsule Take 2 capsules by mouth daily.     Zavegepant HCl (ZAVZPRET) 10 MG/ACT SOLN Place 1 spray into the nose daily as needed. 2 each 0   Current Facility-Administered Medications  Medication Dose Route Frequency Provider Last Rate Last Admin   Fremanezumab-vfrm SOSY 225 mg  225 mg Subcutaneous Once Anson Fret, MD        Allergies as of 05/07/2022 - Review Complete 04/30/2022  Allergen Reaction Noted   Amoxapine and related Rash 12/29/2014   Amoxicillin Hives and Other (See Comments) 02/26/2015   Percocet [oxycodone-acetaminophen] Rash 06/26/2019    Vitals: BP 124/75   Pulse 61   Ht 5\' 9"  (1.753 m)   Wt 199 lb 12.8 oz (90.6 kg)   BMI 29.51 kg/m  Last Weight:  Wt Readings from Last 1 Encounters:  05/07/22 199 lb 12.8 oz (90.6 kg)   Last Height:   Ht Readings from Last 1 Encounters:  05/07/22 5\' 9"  (1.753 m)     Physical exam: Exam: Gen: NAD, conversant, well nourised, obese, well groomed                     CV: RRR, no MRG. No Carotid Bruits. No peripheral edema, warm, nontender Eyes: Conjunctivae clear without exudates or hemorrhage  Neuro: Detailed Neurologic Exam  Speech:    Speech is normal; fluent and spontaneous with normal comprehension.  Cognition:    The patient is oriented to person, place, and time;     recent and remote memory intact;     language fluent;     normal attention, concentration,     fund of knowledge Cranial Nerves:    The pupils are equal, round, and reactive to light. The fundi are normal and spontaneous venous pulsations are present. Visual fields are full to finger confrontation. Extraocular movements are intact. Trigeminal sensation is intact and the muscles of mastication are normal. The face is symmetric. The palate elevates in the midline. Hearing intact. Voice is normal. Shoulder shrug is normal. The tongue has  normal motion without fasciculations.   Coordination:    Normal finger to nose and heel to shin. Normal rapid alternating movements.   Gait:    Heel-toe and tandem gait are normal.   Motor Observation:    No asymmetry, no atrophy, and no involuntary movements noted. Tone:    Normal muscle tone.    Posture:    Posture is normal. normal erect    Strength:    Strength is V/V in the upper and lower limbs.      Sensation: intact to LT     Reflex Exam:  DTR's:    Deep tendon reflexes in the upper and lower extremities are normal bilaterally.   Toes:    The toes are downgoing bilaterally.   Clonus:    Clonus is absent.    Assessment/Plan:  chronic migraines tried multiple meds  Addendum 05/30/2022: Ajovy doing excellent now has <8 migraine days a month and <  12 total headache days a month will order nurtec.  Acute: Imitrex/bandryl - percocet only if inytractable and can save you from the ED SAMPLES: Zavzpret once daily Nurtec once dail at onset or prior to when you think you may have a migraine bc of long half life. In the next 8 days can take it daily to bridge you. Elyxyb liquid strong ibuprofen  Preventatively: Ajovy (Emgality) then recommend Botox CTA Head: will order, fhx cerebral aneurysms, pulsatile tinnitus Inject Ajovy today and order  Ask about pump and dump at [facility] for CT contrast  Orders Placed This Encounter  Procedures   CT ANGIO HEAD W OR WO CONTRAST   Meds ordered this encounter  Medications   SUMAtriptan (IMITREX) 100 MG tablet    Sig: Take 1 tablet (100 mg total) by mouth once as needed for up to 1 dose. May repeat in 2 hours if headache persists or recurs.    Dispense:  12 tablet    Refill:  12    Keep on file   Rimegepant Sulfate (NURTEC) 75 MG TBDP    Sig: Take 1 tablet (75 mg total) by mouth daily as needed. Take one daily for 8 days. Then as needed as discussed.    Dispense:  16 tablet    Refill:  0   Zavegepant HCl (ZAVZPRET)  10 MG/ACT SOLN    Sig: Place 1 spray into the nose daily as needed.    Dispense:  2 each    Refill:  0   Celecoxib (ELYXYB) 120 MG/4.8ML SOLN    Sig: Take 1 Dose by mouth daily as needed.    Dispense:  4.8 mL    Refill:  0   Fremanezumab-vfrm SOSY 225 mg    [id number] 05-2024   ketorolac (TORADOL) injection 60 mg    LOT [id number],  exp 08/2023   HYDROcodone-acetaminophen (NORCO/VICODIN) 5-325 MG tablet    Sig: Take 1 tablet by mouth every 6 (six) hours as needed for moderate pain. Patient states taken in the past without a problem please have pharmacist review with her says reaction: rash    Dispense:  30 tablet    Refill:  0    Patient states taken in the past without a problem please have pharmacist review with her says reaction: rash   Fremanezumab-vfrm (AJOVY) 225 MG/1.5ML SOAJ    Sig: Inject 225 mg into the skin every 30 (thirty) days. BIN#: [id number] PCN#: PDMI GROUP#: [id number] ID#: [id number] EXPIRES: 01/20/2023    Dispense:  1.5 mL    Refill:  11    COPAY CARD: BIN#: [id number] PCN#: PDMI GROUP#: [id number] ID#: [id number] EXPIRES: 01/20/2023    Cc: Lewis Moccasin, MD,  Lewis Moccasin, MD  Naomie Dean, MD  [facility] [address] [place], [place] [address]  Phone [phone] Fax [phone]  I spent over 60  minutes of face-to-face and non-face-to-face time with patient on the  1. Chronic migraine without aura without status migrainosus, not intractable   2. Worsening headaches   3. Pulsatile tinnitus   4. FHx: cerebral aneurysm    diagnosis.  This included previsit chart review, lab review, study review, order entry, electronic health record documentation, patient education on the different diagnostic and therapeutic options, counseling and coordination of care, risks and benefits of management, compliance, or risk factor reduction

## 2022-05-10 ENCOUNTER — Encounter: Payer: Self-pay | Admitting: Neurology

## 2022-05-10 DIAGNOSIS — G43709 Chronic migraine without aura, not intractable, without status migrainosus: Secondary | ICD-10-CM | POA: Insufficient documentation

## 2022-05-10 MED ORDER — HYDROCODONE-ACETAMINOPHEN 5-325 MG PO TABS: 1.0000 | ORAL_TABLET | Freq: Four times a day (QID) | ORAL | 0 refills | Status: DC | PRN

## 2022-05-10 MED ORDER — AJOVY 225 MG/1.5ML ~~LOC~~ SOAJ
225.0000 mg | SUBCUTANEOUS | 11 refills | Status: DC
Start: 2022-05-10 — End: 2022-09-01

## 2022-05-13 ENCOUNTER — Telehealth: Payer: Self-pay | Admitting: Neurology

## 2022-05-13 NOTE — Telephone Encounter (Signed)
Yetta Numbers: [id number] exp. 05/13/22-06/11/22 sent to GI [phone]

## 2022-05-14 ENCOUNTER — Encounter: Payer: Self-pay | Admitting: Neurology

## 2022-05-14 ENCOUNTER — Telehealth: Payer: Self-pay | Admitting: Neurology

## 2022-05-14 NOTE — Telephone Encounter (Signed)
See prior phone note to start botox migraine approval thanks

## 2022-05-14 NOTE — Telephone Encounter (Signed)
Got it.

## 2022-05-14 NOTE — Telephone Encounter (Signed)
I contacted patient about her reaction to ajovy I am going to move her to botox. Patient with chronic migraines. Please start botox approval process thank you she can stay with dr Lucia Gaskins at least for first botox thanks    From a thorough review of records, medications tried that can be used in migraine management include: Propranolol IR, could not tolerate propranolol ER, NSAIDs, sumatriptan, Effexor, Zofran, diclofenac, Tylenol, Decadron injections, nortiptyline not tolerates, Benadryl capsules, Benadryl injections, hydrocodone, ibuprofen, ketorolac injections and oral, Methergine, Medrol Dosepak, Reglan tablets and injections, Zofran oral and injections, Phenergan oral and injections, scopolamine patches, Zoloft, Imitrex, rizatriptan, Effexor, topiramate and Zonegran.  Magnesium, B2, imitrex is effective, had a bad reaction to Ajovy contraindicated, aimovig contraindicated due to constipation

## 2022-05-14 NOTE — Telephone Encounter (Signed)
Chronic Migraine CPT 64615  Botox J0585 Units:200  G43.709 Chronic Migraine without aura, not intractable, without status migrainous  New start Botox under Dr Lucia Gaskins.

## 2022-05-19 ENCOUNTER — Telehealth: Payer: Self-pay | Admitting: Neurology

## 2022-05-19 NOTE — Telephone Encounter (Signed)
Sent mychart msg informing pt of appt change due to provider template change 

## 2022-05-20 ENCOUNTER — Encounter: Payer: Self-pay | Admitting: Neurology

## 2022-05-24 ENCOUNTER — Encounter: Payer: Self-pay | Admitting: Neurology

## 2022-05-28 ENCOUNTER — Telehealth: Payer: Self-pay

## 2022-05-28 ENCOUNTER — Other Ambulatory Visit (HOSPITAL_COMMUNITY): Payer: Self-pay

## 2022-05-28 NOTE — Telephone Encounter (Signed)
A new telephone call encounter has been made for this PA Request-please see telephone note dated 05/28/2022. 

## 2022-05-28 NOTE — Telephone Encounter (Signed)
    Benefit Verification [id number] Submitted!

## 2022-05-28 NOTE — Telephone Encounter (Signed)
Pharmacy Patient Advocate Encounter   Received notification from [facility] that prior authorization for Botox 200 Units is required/requested.   PA submitted on 05/28/2022 to (ins) [employer] via [employer] or (Medicaid) confirmation # [id number] Status is pending

## 2022-05-29 ENCOUNTER — Other Ambulatory Visit: Payer: Self-pay

## 2022-05-30 DIAGNOSIS — G43009 Migraine without aura, not intractable, without status migrainosus: Secondary | ICD-10-CM | POA: Insufficient documentation

## 2022-05-30 MED ORDER — NURTEC 75 MG PO TBDP
75.0000 mg | ORAL_TABLET | Freq: Every day | ORAL | 6 refills | Status: DC | PRN
Start: 2022-05-30 — End: 2022-06-05

## 2022-05-30 NOTE — Addendum Note (Signed)
Addended by: Naomie Dean B on: 05/30/2022 09:03 AM   Modules accepted: Orders

## 2022-06-02 ENCOUNTER — Telehealth: Payer: Self-pay

## 2022-06-02 NOTE — Telephone Encounter (Signed)
Pharmacy Patient Advocate Encounter   Received notification from [facility] that prior authorization for Nurtec 75MG  dispersible tablets is required/requested.   PA submitted on 06/02/2022 to (ins) [employer] via [employer] or (Medicaid) confirmation # [id number]  Status is pending

## 2022-06-02 NOTE — Telephone Encounter (Signed)
I reviewed the denial. Even though the Rx team submitted the information of tried/failed medications which states that patient failed Ajovy due to bad reaction and Aimovig is contraindicated, [employer] is still pushing back saying patient needs to try a CGRP like Emgality, Aimovig or that she cannot take any of them.   The PA question asks this:    I called [employer] and spoke with Delos Haring. She reviewed the case. They are still saying patient should try Emgality first before Botox can be approved. There is an option to do a peer to peer so I have that scheduled for 9 AM tomorrow. If it is denied, patient will need to try Emgality and we will have 180 days from 05/30/22 to appeal the decision after she tries Emgality.

## 2022-06-02 NOTE — Telephone Encounter (Signed)
Pharmacy Patient Advocate Encounter  Received notification from [facility] that the request for prior authorization for Botox has been denied due to See below.      Please be advised we currently do not have a Pharmacist to review denials, therefore you will need to process appeals accordingly as needed. Thanks for your support at this time.  Denial letter has been placed into the chart under the media tab.

## 2022-06-03 ENCOUNTER — Other Ambulatory Visit: Payer: Self-pay

## 2022-06-03 ENCOUNTER — Other Ambulatory Visit (HOSPITAL_COMMUNITY): Payer: Self-pay

## 2022-06-03 MED ORDER — BOTOX 200 UNITS IJ SOLR
INTRAMUSCULAR | 3 refills | Status: DC
  Filled 2022-06-03: qty 1, fill #0

## 2022-06-03 NOTE — Telephone Encounter (Signed)
Scheduled for first available of 07/22/22 and added to cancellation list.

## 2022-06-03 NOTE — Telephone Encounter (Signed)
Ok no problem, thanks

## 2022-06-03 NOTE — Telephone Encounter (Signed)
Received call from [facility] for peer to peer. I advised the patient reported a bad injection site reaction to Ajovy and Aimovig is contraindicated due to constipation. He was able to approve the Botox for 24 weeks starting 05/29/22. He will fax the approval letter to [phone].

## 2022-06-03 NOTE — Telephone Encounter (Signed)
Approval letter has been placed into chart-looks like it was approved for [employer].

## 2022-06-03 NOTE — Addendum Note (Signed)
Addended by: Bertram Savin on: 06/03/2022 09:14 AM   Modules accepted: Orders

## 2022-06-04 ENCOUNTER — Other Ambulatory Visit: Payer: Self-pay | Admitting: *Deleted

## 2022-06-04 ENCOUNTER — Other Ambulatory Visit (HOSPITAL_COMMUNITY): Payer: Self-pay

## 2022-06-05 ENCOUNTER — Telehealth: Payer: Self-pay | Admitting: Pharmacy Technician

## 2022-06-05 MED ORDER — UBRELVY 100 MG PO TABS: ORAL_TABLET | ORAL | 11 refills | Status: DC

## 2022-06-05 NOTE — Telephone Encounter (Signed)
    I have not received a denial letter yet and there is no explanation in CMM-However the Abigail George was mentioned in the clinical questions so it is a possibility that is the problem.

## 2022-06-05 NOTE — Telephone Encounter (Signed)
Patient Advocate Encounter   Received notification that prior authorization for Ubrelvy 100MG  tablets is required.   PA submitted on 06/05/2022 [employer] [employer] Electronic Request Form Status is pending

## 2022-06-05 NOTE — Telephone Encounter (Signed)
Please do , thank you

## 2022-06-05 NOTE — Addendum Note (Signed)
Addended by: Guy Begin on: 06/05/2022 01:39 PM   Modules accepted: Orders

## 2022-06-05 NOTE — Telephone Encounter (Signed)
Order placed for Ubrelvy 100mg  tabs #16 with 11 refills.

## 2022-06-06 ENCOUNTER — Other Ambulatory Visit (HOSPITAL_COMMUNITY): Payer: Self-pay

## 2022-06-06 NOTE — Telephone Encounter (Signed)
Pharmacy Patient Advocate Encounter  Prior Authorization for Ubrelvy 100MG  tablets has been approved by [employer] [place] (ins).    PA # PA Case ID: [id number]  Effective dates: 06/05/2022 through 08/28/2022

## 2022-06-10 ENCOUNTER — Other Ambulatory Visit: Payer: Self-pay | Admitting: *Deleted

## 2022-06-10 DIAGNOSIS — G43009 Migraine without aura, not intractable, without status migrainosus: Secondary | ICD-10-CM

## 2022-06-10 MED ORDER — NURTEC 75 MG PO TBDP
75.0000 mg | ORAL_TABLET | Freq: Every day | ORAL | 6 refills | Status: DC | PRN
Start: 2022-06-10 — End: 2023-01-20

## 2022-06-14 ENCOUNTER — Other Ambulatory Visit (HOSPITAL_COMMUNITY): Payer: Self-pay

## 2022-06-24 ENCOUNTER — Other Ambulatory Visit: Payer: Self-pay

## 2022-06-30 ENCOUNTER — Telehealth: Payer: Self-pay | Admitting: *Deleted

## 2022-06-30 NOTE — Telephone Encounter (Signed)
Completed a new PA for Nurtec reflecting patient's failure of Ubrelvy. Key: [id number]. Awaiting determination from [employer].

## 2022-06-30 NOTE — Telephone Encounter (Signed)
Fax has been received from [facility] confirming approval through 09/22/22. I faxed this letter to [facility] [address] and also called them. They will fill it for the patient.   PA reference # is [id number]

## 2022-07-01 ENCOUNTER — Ambulatory Visit
Admission: RE | Admit: 2022-07-01 | Discharge: 2022-07-01 | Disposition: A | Discharge status: Home / Self Care | Payer: BC Managed Care – PPO | Source: Ambulatory Visit | Attending: Neurology | Admitting: Neurology

## 2022-07-01 DIAGNOSIS — Z8249 Family history of ischemic heart disease and other diseases of the circulatory system: Secondary | ICD-10-CM

## 2022-07-01 DIAGNOSIS — H93A9 Pulsatile tinnitus, unspecified ear: Secondary | ICD-10-CM

## 2022-07-01 DIAGNOSIS — R519 Headache, unspecified: Secondary | ICD-10-CM

## 2022-07-01 MED ORDER — IOPAMIDOL (ISOVUE-370) INJECTION 76%
75.0000 mL | Freq: Once | INTRAVENOUS | Status: AC | PRN
  Administered 2022-07-01: 75 mL via INTRAVENOUS

## 2022-07-08 ENCOUNTER — Telehealth: Payer: Self-pay | Admitting: Neurology

## 2022-07-08 NOTE — Telephone Encounter (Signed)
Pt said she is following up on CT results. Requesting a call back from nurse.

## 2022-07-09 NOTE — Telephone Encounter (Signed)
[facility] hadnt read it yet, I called the reading room and asked them to read it we should have it soon it must have slipped by them or there is sometimes confusion on who reads what [place] or them. thanks

## 2022-07-10 NOTE — Telephone Encounter (Signed)
I relayed to pt via mychart the normal CTA results, read by radiologist 07-09-2022.

## 2022-07-22 ENCOUNTER — Ambulatory Visit: Payer: BC Managed Care – PPO | Admitting: Neurology

## 2022-07-22 DIAGNOSIS — G43709 Chronic migraine without aura, not intractable, without status migrainosus: Secondary | ICD-10-CM

## 2022-07-22 DIAGNOSIS — G43009 Migraine without aura, not intractable, without status migrainosus: Secondary | ICD-10-CM

## 2022-07-22 MED ORDER — ONABOTULINUMTOXINA 100 UNITS IJ SOLR
155.0000 [IU] | Freq: Once | INTRAMUSCULAR | Status: DC
Start: 2022-07-22 — End: 2023-11-30

## 2022-07-22 MED ORDER — ONABOTULINUMTOXINA 200 UNITS IJ SOLR
155.0000 [IU] | Freq: Once | INTRAMUSCULAR | Status: AC
Start: 2022-07-22 — End: 2022-07-22
  Administered 2022-07-22: 155 [IU] via INTRAMUSCULAR

## 2022-07-22 NOTE — Progress Notes (Signed)
Consent Form Botulism Toxin Injection For Chronic Migraine  07/22/2022: first botox  Reviewed orally with patient, additionally signature is on file:  Botulism toxin has been approved by the Federal drug administration for treatment of chronic migraine. Botulism toxin does not cure chronic migraine and it may not be effective in some patients.  The administration of botulism toxin is accomplished by injecting a small amount of toxin into the muscles of the neck and head. Dosage must be titrated for each individual. Any benefits resulting from botulism toxin tend to wear off after 3 months with a repeat injection required if benefit is to be maintained. Injections are usually done every 3-4 months with maximum effect peak achieved by about 2 or 3 weeks. Botulism toxin is expensive and you should be sure of what costs you will incur resulting from the injection.  The side effects of botulism toxin use for chronic migraine may include:   -Transient, and usually mild, facial weakness with facial injections  -Transient, and usually mild, head or neck weakness with head/neck injections  -Reduction or loss of forehead facial animation due to forehead muscle weakness  -Eyelid drooping  -Dry eye  -Pain at the site of injection or bruising at the site of injection  -Double vision  -Potential unknown long term risks  Contraindications: You should not have Botox if you are pregnant, nursing, allergic to albumin, have an infection, skin condition, or muscle weakness at the site of the injection, or have myasthenia gravis, Lambert-Eaton syndrome, or ALS.  It is also possible that as with any injection, there may be an allergic reaction or no effect from the medication. Reduced effectiveness after repeated injections is sometimes seen and rarely infection at the injection site may occur. All care will be taken to prevent these side effects. If therapy is given over a long time, atrophy and wasting in the  muscle injected may occur. Occasionally the patient's become refractory to treatment because they develop antibodies to the toxin. In this event, therapy needs to be modified.  I have read the above information and consent to the administration of botulism toxin.    BOTOX PROCEDURE NOTE FOR MIGRAINE HEADACHE    Contraindications and precautions discussed with patient(above). Aseptic procedure was observed and patient tolerated procedure. Procedure performed by Dr. Artemio Aly  The condition has existed for more than 6 months, and pt does not have a diagnosis of ALS, Myasthenia Gravis or Lambert-Eaton Syndrome.  Risks and benefits of injections discussed and pt agrees to proceed with the procedure.  Written consent obtained  These injections are medically necessary. Pt  receives good benefits from these injections. These injections do not cause sedations or hallucinations which the oral therapies may cause.  Description of procedure:  The patient was placed in a sitting position. The standard protocol was used for Botox as follows, with 5 units of Botox injected at each site:   -Procerus muscle, midline injection  -Corrugator muscle, bilateral injection  -Frontalis muscle, bilateral injection, with 2 sites each side, medial injection was performed in the upper one third of the frontalis muscle, in the region vertical from the medial inferior edge of the superior orbital rim. The lateral injection was again in the upper one third of the forehead vertically above the lateral limbus of the cornea, 1.5 cm lateral to the medial injection site.  -Temporalis muscle injection, 4 sites, bilaterally. The first injection was 3 cm above the tragus of the ear, second injection site was 1.5 cm  to 3 cm up from the first injection site in line with the tragus of the ear. The third injection site was 1.5-3 cm forward between the first 2 injection sites. The fourth injection site was 1.5 cm posterior to the  second injection site.   -Occipitalis muscle injection, 3 sites, bilaterally. The first injection was done one half way between the occipital protuberance and the tip of the mastoid process behind the ear. The second injection site was done lateral and superior to the first, 1 fingerbreadth from the first injection. The third injection site was 1 fingerbreadth superiorly and medially from the first injection site.  -Cervical paraspinal muscle injection, 2 sites, bilateral knee first injection site was 1 cm from the midline of the cervical spine, 3 cm inferior to the lower border of the occipital protuberance. The second injection site was 1.5 cm superiorly and laterally to the first injection site.  -Trapezius muscle injection was performed at 3 sites, bilaterally. The first injection site was in the upper trapezius muscle halfway between the inflection point of the neck, and the acromion. The second injection site was one half way between the acromion and the first injection site. The third injection was done between the first injection site and the inflection point of the neck.   Will return for repeat injection in 3 months.   200 units of Botox was used, 45u Botox not injected was wasted. The patient tolerated the procedure well, there were no complications of the above procedure.

## 2022-07-22 NOTE — Progress Notes (Signed)
Botox 200 units x 1 vial  LOT #: [id number] EXP: 2026/08 NDC: 0023-3921-02  BB  Witnessed by Simone. 

## 2022-08-06 IMAGING — US US BREAST BX W LOC DEV 1ST LESION IMG BX SPEC US GUIDE*L*
1 series · 12 of 14 positions shown · non-contrast
Comparison: Previous exam(s).
COMPARISON: Previous exam(s).

Addendum:
CLINICAL DATA: Biopsy of a left breast mass at 8 o'clock

EXAM:
ULTRASOUND GUIDED LEFT BREAST CORE NEEDLE BIOPSY

[Series 1: us breast bx w loc dev 1st lesion img bx spec us g · 0.07mm/px · 12 of 14 slices shown]
[im 1/14]
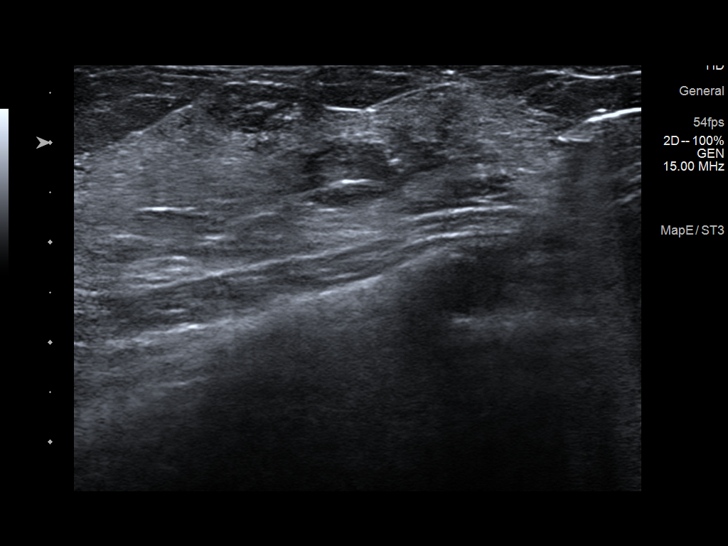
[im 2/14]
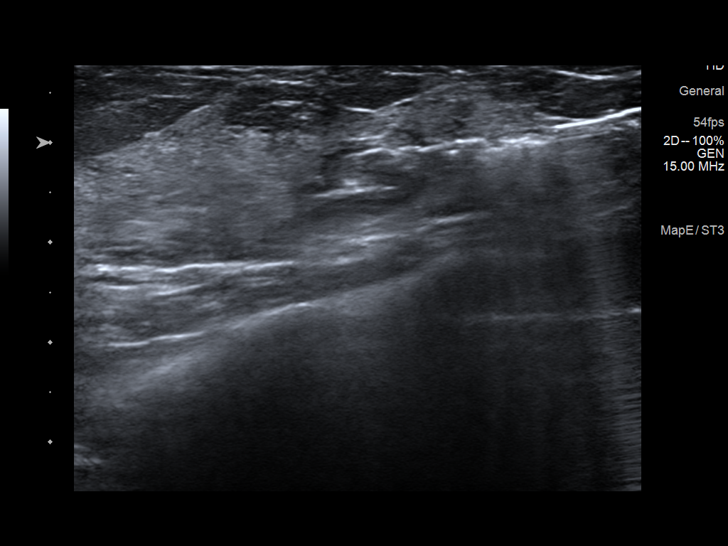
[im 3/14]
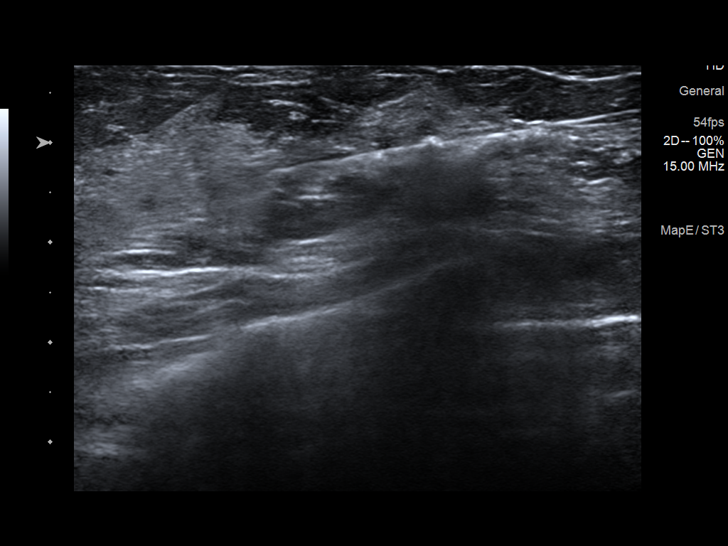
[im 5/14]
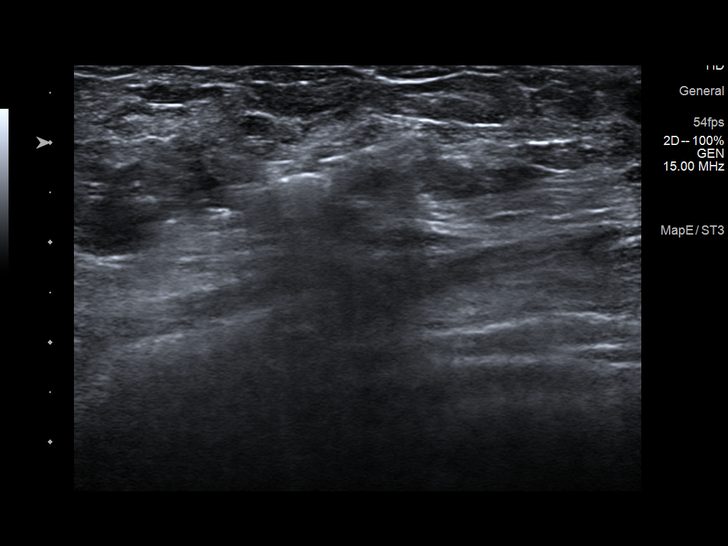
[im 6/14]
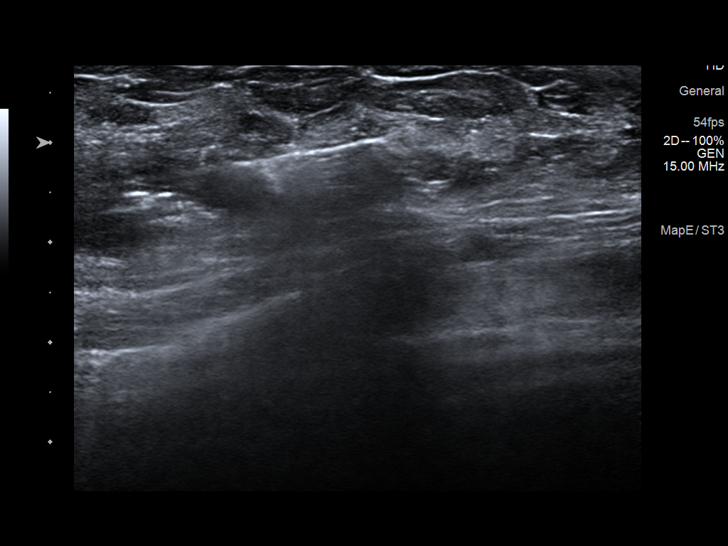
[im 7/14]
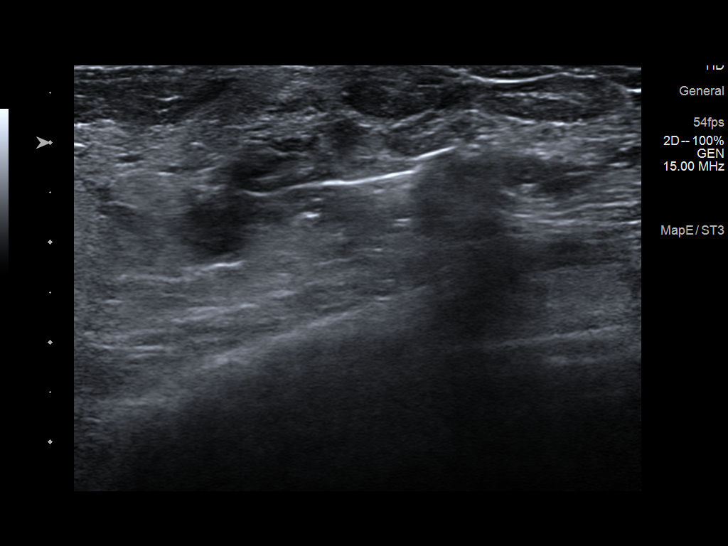
[im 8/14]
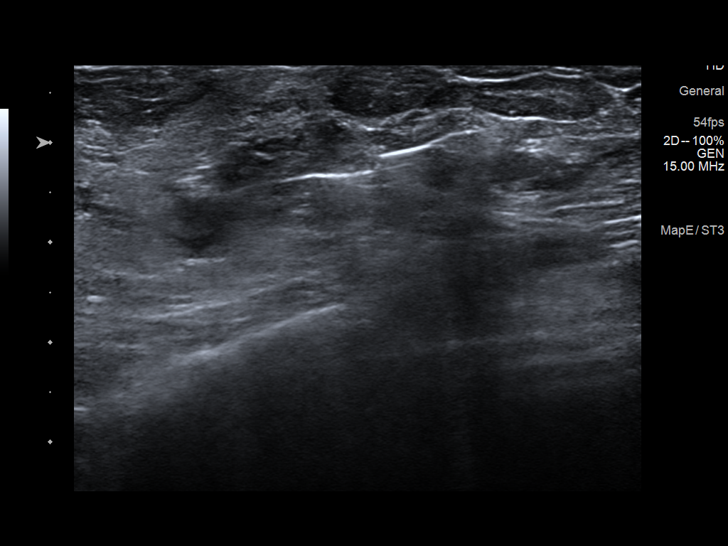
[im 9/14]
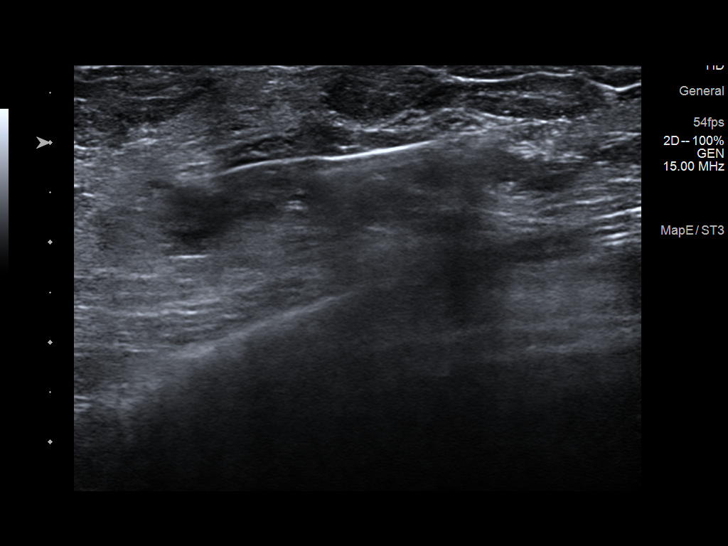
[im 10/14]
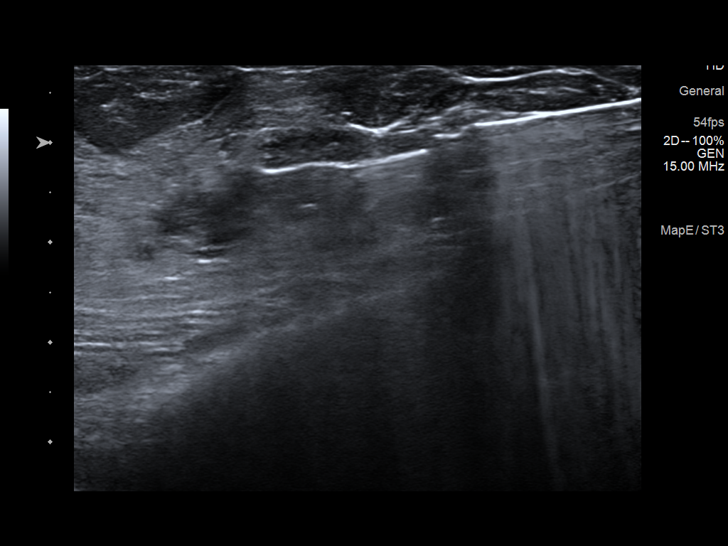
[im 12/14]
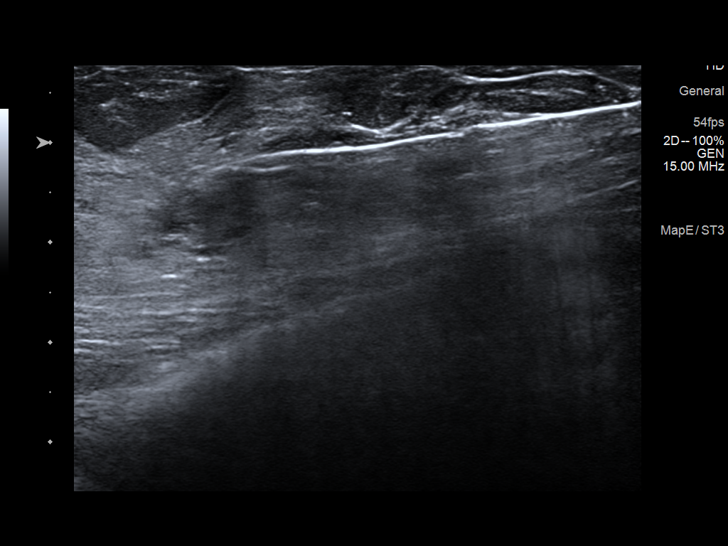
[im 13/14]
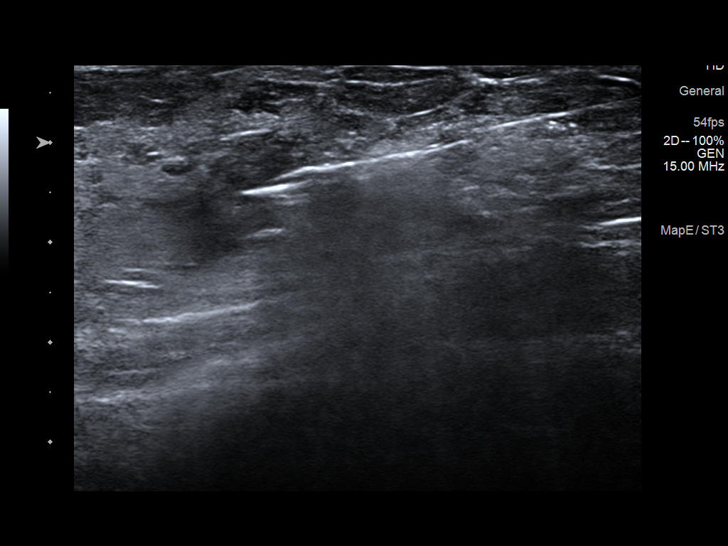
[im 14/14]
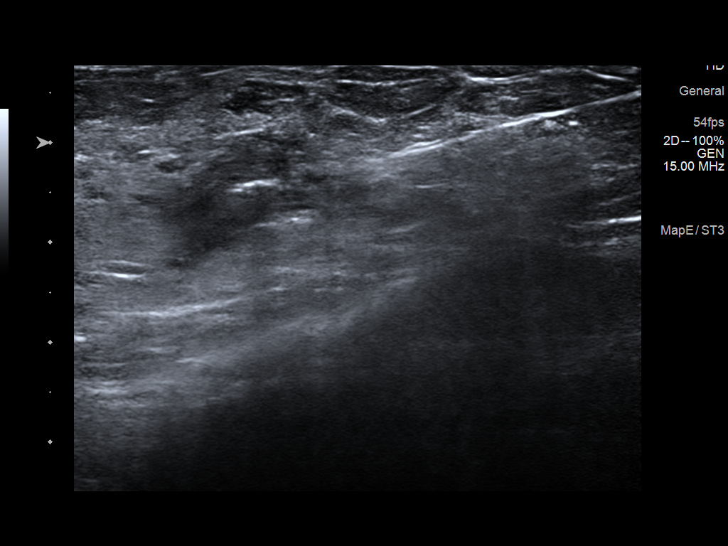

[12 of 14 positions shown; findings below may reference images not displayed]



Lesion quadrant: 8 o'clock left breast

Using sterile technique and 1% Lidocaine as local anesthetic, under
direct ultrasound visualization, a 14 gauge Garzon device was
used to perform biopsy of an 8 o'clock left breast mass using a
medial approach. At the conclusion of the procedure a ribbon shaped
tissue marker clip was deployed into the biopsy cavity. Follow up 2
view mammogram was performed and dictated separately.
IMPRESSION: Ultrasound guided biopsy of an 8 o'clock left breast mass. No
apparent complications.

ADDENDUM:
Pathology revealed ADENOMA WITH LACTATIONAL CHANGES- NO MALIGNANCY
IDENTIFIED of the LEFT breast, 8 o'clock (ribbon clip. This was
found to be concordant by Dr. Mbae Eric Banjar.

Pathology results were discussed with the patient by telephone. The
patient returned to the [REDACTED] of biopsy due to bleeding
from biopsy site when her baby nursed on left side. She was
evaluated by Dr. Mbae Eric Banjar. [place] quick clot bandage was applied
with compression wrap. Today, 04/01/2021, the patient reported doing
well after the biopsy with tenderness at the site. Post biopsy
instructions and care were reviewed and questions were answered. The
patient was encouraged to call [REDACTED] for any additional concerns.

The patient was instructed to continue with monthly self breast
examinations, clinical follow-up as needed, and to return for annual
mammography at 41.

Pathology results reported by Foday Trigo RN on 04/01/2021.



Lesion quadrant: 8 o'clock left breast

Using sterile technique and 1% Lidocaine as local anesthetic, under
direct ultrasound visualization, a 14 gauge Garzon device was
used to perform biopsy of an 8 o'clock left breast mass using a
medial approach. At the conclusion of the procedure a ribbon shaped
tissue marker clip was deployed into the biopsy cavity. Follow up 2
view mammogram was performed and dictated separately.
IMPRESSION: Ultrasound guided biopsy of an 8 o'clock left breast mass. No
apparent complications.

## 2022-08-18 ENCOUNTER — Other Ambulatory Visit (HOSPITAL_COMMUNITY): Payer: Self-pay

## 2022-08-19 ENCOUNTER — Encounter: Payer: Self-pay | Admitting: Neurology

## 2022-08-28 ENCOUNTER — Ambulatory Visit: Payer: BC Managed Care – PPO | Admitting: Neurology

## 2022-09-01 ENCOUNTER — Ambulatory Visit: Payer: BC Managed Care – PPO | Admitting: Gastroenterology

## 2022-09-01 ENCOUNTER — Encounter: Payer: Self-pay | Admitting: Gastroenterology

## 2022-09-01 ENCOUNTER — Telehealth: Payer: BC Managed Care – PPO | Admitting: Neurology

## 2022-09-01 VITALS — BP 126/78 | HR 88 | Wt 204.6 lb

## 2022-09-01 DIAGNOSIS — R109 Unspecified abdominal pain: Secondary | ICD-10-CM

## 2022-09-01 NOTE — Progress Notes (Signed)
Assessment    Intermittent right-sided abdominal muscular cramping  Mild gas   Recommendations   Physical therapy consultation for abdominal wall muscle cramping. In the interim apply moist heat and massage the area.  If symptoms persist or worsen repeat CT AP to R/O a spigelian hernia.  Low gas diet.  Gas-X (or similar medication) tid prn GI follow up prn   HPI   Chief complaint: Abdominal pain and gas  Patient profile:  Abigail George is a 37 y.o. female referred by Lewis Moccasin, MD for intermittent right-sided abdominal pain and gas.  She was previously seen for diarrhea and GERD during pregnancy on August 25, 2019.  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  She delivered a healthy 10+ pound baby boy.  Over the past several months she notes focal right-sided abdominal pain associated with a mild bulging that develops when bending over.  When she stands up and stretches her symptoms resolve in a few minutes.  The symptoms are not associated with meals or bowel movements.  She notes intestinal gas that is bothersome for few years and has not changed. Denies weight loss, constipation, diarrhea, change in stool caliber, melena, hematochezia, nausea, vomiting, dysphagia, reflux symptoms, chest pain.   Previous Labs / Imaging::    Latest Ref Rng & Units 12/27/2019    5:27 AM 12/23/2019    9:23 AM 08/25/2019    9:25 AM  CBC  WBC 4.0 - 10.5 K/uL 9.9  6.4  8.5   Hemoglobin 12.0 - 15.0 g/dL 54.6  27.0  35.0   Hematocrit 36.0 - 46.0 % 33.5  35.6  37.7   Platelets 150 - 400 K/uL 206  226  226.0     No results found for: "LIPASE"    Latest Ref Rng & Units 09/02/2019    3:30 PM 08/25/2019    9:25 AM 01/07/2015    8:36 AM  CMP  Glucose 70 - 99 mg/dL 99  82  99   BUN 6 - 23 mg/dL 7  5  12    Creatinine 0.40 - 1.20 mg/dL 0.93  8.18  2.99   Sodium 135 - 145 mEq/L 135  135  139   Potassium 3.5 -  5.1 mEq/L 3.6  3.3  4.2   Chloride 96 - 112 mEq/L 103  104  104   CO2 19 - 32 mEq/L 23  24  27    Calcium 8.4 - 10.5 mg/dL 9.1  8.7  9.9   Total Protein 6.0 - 8.3 g/dL 6.3  6.4  7.7   Total Bilirubin 0.2 - 1.2 mg/dL 0.3  0.3  1.1   Alkaline Phos 39 - 117 U/L 45  44  45   AST 0 - 37 U/L 22  25  21    ALT 0 - 35 U/L 29  37  17      Previous GI evaluation    Endoscopies:  None  Imaging:   CT AP 10/04/2020: No acute findings    Past Medical History:  Diagnosis Date   Anemia    Anxiety    Cesarean delivery delivered 01/15/2020   Depression    Fibrocystic breast changes    GERD (gastroesophageal reflux disease)    Migraines 12/2007   Past Surgical History:  Procedure Laterality Date   BREAST BIOPSY Left    benign   CESAREAN SECTION N/A 05/08/2016   Procedure: CESAREAN  SECTION;  Surgeon: Olivia Mackie, MD;  Location: [facility] BIRTHING SUIT[facility];  Service: Obstetrics;  Laterality: N/A;   CESAREAN SECTION N/A 12/26/2019   Procedure: Repeat CESAREAN SECTION;  Surgeon: Olivia Mackie, MD;  Location: [facility] LD ORS;  Service: Obstetrics;  Laterality: N/A;  EDD: 01/02/20   IUD REMOVAL  05/16/2014   TONSILLECTOMY     TYMPANOSTOMY TUBE PLACEMENT     Family History  Problem Relation Age of Onset   Migraines Mother    Alcohol abuse Mother    Cerebral aneurysm Father    Migraines Brother    Cerebral aneurysm Brother    Tuberculosis Brother    Crohn's disease Maternal Grandmother    Cervical cancer Maternal Grandmother    Tongue cancer Maternal Grandfather    Ovarian cancer Paternal Grandmother    Colon cancer Neg Hx    Social History   Tobacco Use   Smoking status: Never   Smokeless tobacco: Never  Vaping Use   Vaping status: Never Used  Substance Use Topics   Alcohol use: Not Currently    Alcohol/week: 0.0 standard drinks of alcohol    Comment: less than 1 a week   Drug use: No   Current Outpatient Medications  Medication Sig Dispense Refill   betamethasone dipropionate  0.05 % lotion Apply topically daily.     Calcipotriene 0.005 % solution Apply topically.     D3 HIGH POTENCY 125 MCG (5000 UT) capsule Take 5,000 Units by mouth daily.     HYDROcodone-acetaminophen (NORCO/VICODIN) 5-325 MG tablet Take 1 tablet by mouth every 6 (six) hours as needed for moderate pain. Patient states taken in the past without a problem please have pharmacist review with her says reaction: rash 30 tablet 0   Methylphenidate HCl ER, XR, 15 MG CP24 TAKE 1 CAPSULE BY MOUTH DAILY IN THE MORNING Oral for 30 Days     ondansetron (ZOFRAN) 4 MG tablet Take 1 tablet by mouth 3 times daily as needed for nausea and vomiting with migraines     Rimegepant Sulfate (NURTEC) 75 MG TBDP Take 1 tablet (75 mg total) by mouth daily as needed. For migraines. Take as close to onset of migraine as possible. One daily maximum. 16 tablet 6   SUMAtriptan (IMITREX) 100 MG tablet Take 1 tablet (100 mg total) by mouth once as needed for up to 1 dose. May repeat in 2 hours if headache persists or recurs. 12 tablet 12   venlafaxine XR (EFFEXOR-XR) 37.5 MG 24 hr capsule Take 2 capsules by mouth daily.     Current Facility-Administered Medications  Medication Dose Route Frequency Provider Last Rate Last Admin   botulinum toxin Type A (BOTOX) injection 155 Units  155 Units Intramuscular Once Anson Fret, MD       Fremanezumab-vfrm SOSY 225 mg  225 mg Subcutaneous Once Anson Fret, MD       Allergies  Allergen Reactions   Amoxapine And Related Rash   Amoxicillin Hives and Other (See Comments)    Has patient had a PCN reaction causing immediate rash, facial/tongue/throat swelling, SOB or lightheadedness with hypotension: No Has patient had a PCN reaction causing severe rash involving mucus membranes or skin necrosis: No Has patient had a PCN reaction that required hospitalization No Has patient had a PCN reaction occurring within the last 10 years: Yes If all of the above answers are "NO", then may  proceed with Cephalosporin use. Lip swelling   Percocet [Oxycodone-Acetaminophen] Rash    Review of Systems:  All other systems reviewed and negative except where noted in HPI.    Physical Exam    Wt Readings from Last 3 Encounters:  09/01/22 204 lb 9.6 oz (92.8 kg)  05/07/22 199 lb 12.8 oz (90.6 kg)  04/30/22 201 lb 9.6 oz (91.4 kg)    BP 126/78   Pulse 88   Wt 204 lb 9.6 oz (92.8 kg)   SpO2 98%   BMI 30.21 kg/m  Constitutional:  Generally well appearing female in no acute distress. HEENT: Pupils normal.  Conjunctivae are normal. No scleral icterus. No oral lesions or deformities noted.  Neck: Supple.  Cardiac: Normal rate, regular rhythm without murmurs. Pulmonary/chest: Effort normal and breath sounds normal. No wheezing, rales or rhonchi. Abdominal: Soft, nondistended, nontender. Active bowel sounds. No palpable HSM, masses or hernias. Rectal:  Not done Extremities: No edema or deformities noted Neurological: Alert and oriented to person, place and time. Psychiatric: Pleasant. Normal mood and affect. Behavior is normal. Skin: Skin is warm and dry. No rashes noted.  Claudette Head, MD   cc:  Referring Provider Lewis Moccasin, MD

## 2022-09-01 NOTE — Patient Instructions (Signed)
Consider consultation for your muscular abdominal pain, with physical therapy.  You have be giving a low gas diet._______________________________________  If your blood pressure at your visit was 140/90 or greater, please contact your primary care physician to follow up on this.  _______________________________________________________  If you are age 36 or older, your body mass index should be between 23-30. Your Body mass index is 30.21 kg/m. If this is out of the aforementioned range listed, please consider follow up with your Primary Care Provider.  If you are age 82 or younger, your body mass index should be between 19-25. Your Body mass index is 30.21 kg/m. If this is out of the aformentioned range listed, please consider follow up with your Primary Care Provider.   ________________________________________________________  The  GI providers would like to encourage you to use [facility] to communicate with providers for non-urgent requests or questions.  Due to long hold times on the telephone, sending your provider a message by [facility] may be a faster and more efficient way to get a response.  Please allow 48 business hours for a response.  Please remember that this is for non-urgent requests.  _______________________________________________________

## 2022-09-04 ENCOUNTER — Telehealth (INDEPENDENT_AMBULATORY_CARE_PROVIDER_SITE_OTHER): Payer: Self-pay | Admitting: Neurology

## 2022-09-04 ENCOUNTER — Encounter: Payer: Self-pay | Admitting: Neurology

## 2022-09-04 DIAGNOSIS — R519 Headache, unspecified: Secondary | ICD-10-CM

## 2022-09-04 NOTE — Progress Notes (Signed)
Botox- 200 units x 1 vial Lot: [id number] Expiration: 06/2024 NDC: 4540-9811-91  Bacteriostatic 0.9% Sodium Chloride-  4mL  Lot: [id number] Expiration: 621308-6578-46 NDC:04/21/2023  Dx: G43.009 Samples  Witnessed by Maryjean Ka

## 2022-09-07 NOTE — Progress Notes (Signed)
No charge, 20 U in each masseters as it is causing temporal headaches when she clenches

## 2022-09-11 ENCOUNTER — Ambulatory Visit: Payer: BC Managed Care – PPO | Admitting: Neurology

## 2022-09-12 ENCOUNTER — Telehealth: Payer: Self-pay

## 2022-09-12 ENCOUNTER — Other Ambulatory Visit (HOSPITAL_COMMUNITY): Payer: Self-pay

## 2022-09-12 NOTE — Telephone Encounter (Signed)
Pharmacy Patient Advocate Encounter   Received notification from CoverMy[employer] that prior authorization for Nurtec 75MG  dispersible tablets is required/requested.   Insurance verification completed.   The patient is insured through [facility] .   Per test claim: PA required; PA started via CoverMyMeds. KEY [id number] . Waiting for clinical questions to populate.

## 2022-09-17 NOTE — Telephone Encounter (Signed)
Clinical questions have been submitted-awaiting determination. 

## 2022-09-19 ENCOUNTER — Other Ambulatory Visit (HOSPITAL_COMMUNITY): Payer: Self-pay

## 2022-09-19 NOTE — Telephone Encounter (Signed)
Pharmacy Patient Advocate Encounter   Received notification from CoverMyMeds that prior authorization for Nurtec 75MG  dispersible tablets is required/requested.   Insurance verification completed.   The patient is insured through [facility] .   Per test claim: PA required; PA started via CoverMyMeds. KEY [id number] . Waiting for clinical questions to populate.

## 2022-09-19 NOTE — Telephone Encounter (Signed)
Pharmacy Patient Advocate Encounter  Received notification from [facility] that Prior Authorization for Nurtec has been DENIED.  Full denial letter will be uploaded to the media tab. See denial reason below.   PA #/Case ID/Reference #: [id number]  Will resubmit for QTY of 8 Tablets

## 2022-09-26 NOTE — Telephone Encounter (Signed)
Clinical questions have been submitted-awaiting determination. 

## 2022-09-29 ENCOUNTER — Other Ambulatory Visit (HOSPITAL_COMMUNITY): Payer: Self-pay

## 2022-09-29 NOTE — Telephone Encounter (Signed)
Pharmacy Patient Advocate Encounter  Received notification from [facility] that Prior Authorization for Nurtec 75MG  dispersible tablets has been APPROVED from 09-26-2022 to 09-25-2023. Insurance limits to 8 tablets per 23 days.   PA #/Case ID/Reference #: [facility]

## 2022-10-14 ENCOUNTER — Ambulatory Visit: Payer: BC Managed Care – PPO | Admitting: Neurology

## 2022-10-14 DIAGNOSIS — G43709 Chronic migraine without aura, not intractable, without status migrainosus: Secondary | ICD-10-CM | POA: Diagnosis not present

## 2022-10-14 MED ORDER — ONABOTULINUMTOXINA 200 UNITS IJ SOLR
155.0000 [IU] | Freq: Once | INTRAMUSCULAR | Status: AC
Start: 2022-10-14 — End: 2022-10-14
  Administered 2022-10-14: 155 [IU] via INTRAMUSCULAR

## 2022-10-14 NOTE — Progress Notes (Signed)
Botox- 200 units x 1 vial Lot: [id number] Expiration: 12/2024 NDC: 2130-8657-84   Bacteriostatic 0.9% Sodium Chloride- 4mL total [id number] Expiration: 04/21/23 NDC: 1324-4010-27   Dx: G43.709 BB   Witnessed by: Alveria Apley

## 2022-10-14 NOTE — Progress Notes (Signed)
Consent Form Botulism Toxin Injection For Chronic Migraine  10/04/2022: Botox has been life changing. >70% improvement in migraine and headache freq and severity 07/22/2022: first botox  Reviewed orally with patient, additionally signature is on file:  Botulism toxin has been approved by the Federal drug administration for treatment of chronic migraine. Botulism toxin does not cure chronic migraine and it may not be effective in some patients.  The administration of botulism toxin is accomplished by injecting a small amount of toxin into the muscles of the neck and head. Dosage must be titrated for each individual. Any benefits resulting from botulism toxin tend to wear off after 3 months with a repeat injection required if benefit is to be maintained. Injections are usually done every 3-4 months with maximum effect peak achieved by about 2 or 3 weeks. Botulism toxin is expensive and you should be sure of what costs you will incur resulting from the injection.  The side effects of botulism toxin use for chronic migraine may include:   -Transient, and usually mild, facial weakness with facial injections  -Transient, and usually mild, head or neck weakness with head/neck injections  -Reduction or loss of forehead facial animation due to forehead muscle weakness  -Eyelid drooping  -Dry eye  -Pain at the site of injection or bruising at the site of injection  -Double vision  -Potential unknown long term risks  Contraindications: You should not have Botox if you are pregnant, nursing, allergic to albumin, have an infection, skin condition, or muscle weakness at the site of the injection, or have myasthenia gravis, Lambert-Eaton syndrome, or ALS.  It is also possible that as with any injection, there may be an allergic reaction or no effect from the medication. Reduced effectiveness after repeated injections is sometimes seen and rarely infection at the injection site may occur. All care will be  taken to prevent these side effects. If therapy is given over a long time, atrophy and wasting in the muscle injected may occur. Occasionally the patient's become refractory to treatment because they develop antibodies to the toxin. In this event, therapy needs to be modified.  I have read the above information and consent to the administration of botulism toxin.    BOTOX PROCEDURE NOTE FOR MIGRAINE HEADACHE    Contraindications and precautions discussed with patient(above). Aseptic procedure was observed and patient tolerated procedure. Procedure performed by Dr. Artemio Aly  The condition has existed for more than 6 months, and pt does not have a diagnosis of ALS, Myasthenia Gravis or Lambert-Eaton Syndrome.  Risks and benefits of injections discussed and pt agrees to proceed with the procedure.  Written consent obtained  These injections are medically necessary. Pt  receives good benefits from these injections. These injections do not cause sedations or hallucinations which the oral therapies may cause.  Description of procedure:  The patient was placed in a sitting position. The standard protocol was used for Botox as follows, with 5 units of Botox injected at each site:   -Procerus muscle, midline injection  -Corrugator muscle, bilateral injection  -Frontalis muscle, bilateral injection, with 2 sites each side, medial injection was performed in the upper one third of the frontalis muscle, in the region vertical from the medial inferior edge of the superior orbital rim. The lateral injection was again in the upper one third of the forehead vertically above the lateral limbus of the cornea, 1.5 cm lateral to the medial injection site.  -Temporalis muscle injection, 4 sites, bilaterally. The first injection  was 3 cm above the tragus of the ear, second injection site was 1.5 cm to 3 cm up from the first injection site in line with the tragus of the ear. The third injection site was 1.5-3  cm forward between the first 2 injection sites. The fourth injection site was 1.5 cm posterior to the second injection site.   -Occipitalis muscle injection, 3 sites, bilaterally. The first injection was done one half way between the occipital protuberance and the tip of the mastoid process behind the ear. The second injection site was done lateral and superior to the first, 1 fingerbreadth from the first injection. The third injection site was 1 fingerbreadth superiorly and medially from the first injection site.  -Cervical paraspinal muscle injection, 2 sites, bilateral knee first injection site was 1 cm from the midline of the cervical spine, 3 cm inferior to the lower border of the occipital protuberance. The second injection site was 1.5 cm superiorly and laterally to the first injection site.  -Trapezius muscle injection was performed at 3 sites, bilaterally. The first injection site was in the upper trapezius muscle halfway between the inflection point of the neck, and the acromion. The second injection site was one half way between the acromion and the first injection site. The third injection was done between the first injection site and the inflection point of the neck.   Will return for repeat injection in 3 months.   200 units of Botox was used, 45u Botox not injected was wasted. The patient tolerated the procedure well, there were no complications of the above procedure.

## 2022-10-27 ENCOUNTER — Encounter: Payer: Self-pay | Admitting: Neurology

## 2022-11-05 ENCOUNTER — Other Ambulatory Visit: Payer: Self-pay | Admitting: Nurse Practitioner

## 2022-11-05 DIAGNOSIS — Z8249 Family history of ischemic heart disease and other diseases of the circulatory system: Secondary | ICD-10-CM

## 2022-11-05 DIAGNOSIS — E782 Mixed hyperlipidemia: Secondary | ICD-10-CM

## 2022-11-27 ENCOUNTER — Ambulatory Visit
Admission: RE | Admit: 2022-11-27 | Discharge: 2022-11-27 | Disposition: A | Discharge status: Home / Self Care | Payer: No Typology Code available for payment source | Source: Ambulatory Visit | Attending: Nurse Practitioner | Admitting: Nurse Practitioner

## 2022-11-27 ENCOUNTER — Ambulatory Visit: Payer: BC Managed Care – PPO | Admitting: *Deleted

## 2022-11-27 ENCOUNTER — Telehealth: Payer: Self-pay

## 2022-11-27 ENCOUNTER — Other Ambulatory Visit: Payer: Self-pay | Admitting: Neurology

## 2022-11-27 DIAGNOSIS — Z8249 Family history of ischemic heart disease and other diseases of the circulatory system: Secondary | ICD-10-CM

## 2022-11-27 DIAGNOSIS — E782 Mixed hyperlipidemia: Secondary | ICD-10-CM

## 2022-11-27 DIAGNOSIS — G43009 Migraine without aura, not intractable, without status migrainosus: Secondary | ICD-10-CM | POA: Diagnosis not present

## 2022-11-27 MED ORDER — KETOROLAC TROMETHAMINE 60 MG/2ML IM SOLN
60.0000 mg | Freq: Once | INTRAMUSCULAR | Status: AC
  Administered 2022-11-27: 60 mg via INTRAMUSCULAR

## 2022-11-27 NOTE — Progress Notes (Signed)
Verbal order for toradol 60mg  IM per Dr. Lucia Gaskins.  Under aseptic technique Toradol 60mg  IM given to R upper outer gluteal quadrant.  Bandaid placed.  Pt tolerated well.  Left after 5 minutes. Will discuss next visit about prescription.  Pt verbalized understanding. Pt to check out. Will see in December for botox.

## 2022-11-27 NOTE — Telephone Encounter (Signed)
Noted  

## 2022-11-27 NOTE — Telephone Encounter (Signed)
Patient called in again stated she has not heard back from anyone yet and is still having a severe migraine, would like to come in for a shot or IV. Would like a call back asap

## 2022-11-27 NOTE — Telephone Encounter (Signed)
Patient called in today c/o severe migraine. Stated that she was told by Dr Lucia Gaskins that if this happens, she can call and get in for an infusion. I advised that I was sending a message back to the nursing staff and also advised to go to urgent care if she could not wait for a call back from [place].  Please give her a call back at your earliest convenience.   Thanks!

## 2022-11-27 NOTE — Telephone Encounter (Signed)
I called pt back. Migraine started yesterday L eye typical migraine, today r eye,  Yesterday took nurtec, then today 2 imitrex, 2 hydrocodone, and zofran.  Nothing has helped.  She has had toradol in office in past that has helped.  No infusion in office.  Has had migraine cocktail in ER and ED.   She is in [place], resting.  Asking for toradol or infusion.  Please advise.  I consulted Dr. Lucia Gaskins.  Ok to come for toradol 60mg  IM.  I called pt and she will come before 4p.  (Has to get son childcare).

## 2022-12-06 ENCOUNTER — Encounter: Payer: Self-pay | Admitting: Neurology

## 2022-12-08 ENCOUNTER — Telehealth: Payer: Self-pay | Admitting: Neurology

## 2022-12-08 NOTE — Telephone Encounter (Signed)
Completed Botox PA renewal form and placed in nurse pod for MD signature.

## 2022-12-10 MED ORDER — HYDROCODONE-ACETAMINOPHEN 5-325 MG PO TABS: 1.0000 | ORAL_TABLET | Freq: Four times a day (QID) | ORAL | 0 refills | Status: DC | PRN

## 2022-12-16 NOTE — Telephone Encounter (Signed)
Faxed signed PA form along with OV notes to [facility] @ [phone].

## 2022-12-16 NOTE — Telephone Encounter (Signed)
Received fax of approval from [facility], pt will continue to be buy/bill.   Auth: [id number] (12/16/22-11/17/23)

## 2023-01-07 ENCOUNTER — Ambulatory Visit: Payer: BC Managed Care – PPO | Admitting: Neurology

## 2023-01-08 ENCOUNTER — Ambulatory Visit: Payer: BC Managed Care – PPO | Admitting: Neurology

## 2023-01-08 DIAGNOSIS — G43709 Chronic migraine without aura, not intractable, without status migrainosus: Secondary | ICD-10-CM | POA: Diagnosis not present

## 2023-01-08 MED ORDER — ONDANSETRON 8 MG PO TBDP: 8.0000 mg | ORAL_TABLET | Freq: Three times a day (TID) | ORAL | 11 refills | Status: DC | PRN

## 2023-01-08 MED ORDER — ONABOTULINUMTOXINA 200 UNITS IJ SOLR
155.0000 [IU] | Freq: Once | INTRAMUSCULAR | Status: AC
Start: 2023-01-08 — End: 2023-01-08
  Administered 2023-01-08: 155 [IU] via INTRAMUSCULAR

## 2023-01-08 NOTE — Progress Notes (Signed)
Botox consent signed  Botox- 200 units x 1 vial Lot: [id number] Expiration: 03/2025 NDC: 5638-7564-33  Bacteriostatic 0.9% Sodium Chloride- 4 mL  Lot: [id number] Expiration: 11/21/2023 NDC: 4166-0630-16  Dx: W10.932 BB Witnessed by Corinna Gab CMA

## 2023-01-08 NOTE — Progress Notes (Signed)
Consent Form Botulism Toxin Injection For Chronic Migraine 01/11/2023: stable. Patient feels that her migraines cause her jaw to ache in her jaw aching also can cause her migraines to worsen it is a trigger for migraines included 15 units in each masseter to see if that helps with migraine severity.  10/04/2022: Botox has been life changing. >70% improvement in migraine and headache freq and severity 07/22/2022: first botox  Reviewed orally with patient, additionally signature is on file:  Botulism toxin has been approved by the Federal drug administration for treatment of chronic migraine. Botulism toxin does not cure chronic migraine and it may not be effective in some patients.  The administration of botulism toxin is accomplished by injecting a small amount of toxin into the muscles of the neck and head. Dosage must be titrated for each individual. Any benefits resulting from botulism toxin tend to wear off after 3 months with a repeat injection required if benefit is to be maintained. Injections are usually done every 3-4 months with maximum effect peak achieved by about 2 or 3 weeks. Botulism toxin is expensive and you should be sure of what costs you will incur resulting from the injection.  The side effects of botulism toxin use for chronic migraine may include:   -Transient, and usually mild, facial weakness with facial injections  -Transient, and usually mild, head or neck weakness with head/neck injections  -Reduction or loss of forehead facial animation due to forehead muscle weakness  -Eyelid drooping  -Dry eye  -Pain at the site of injection or bruising at the site of injection  -Double vision  -Potential unknown long term risks  Contraindications: You should not have Botox if you are pregnant, nursing, allergic to albumin, have an infection, skin condition, or muscle weakness at the site of the injection, or have myasthenia gravis, Lambert-Eaton syndrome, or ALS.  It is also  possible that as with any injection, there may be an allergic reaction or no effect from the medication. Reduced effectiveness after repeated injections is sometimes seen and rarely infection at the injection site may occur. All care will be taken to prevent these side effects. If therapy is given over a long time, atrophy and wasting in the muscle injected may occur. Occasionally the patient's become refractory to treatment because they develop antibodies to the toxin. In this event, therapy needs to be modified.  I have read the above information and consent to the administration of botulism toxin.    BOTOX PROCEDURE NOTE FOR MIGRAINE HEADACHE    Contraindications and precautions discussed with patient(above). Aseptic procedure was observed and patient tolerated procedure. Procedure performed by Dr. Artemio Aly  The condition has existed for more than 6 months, and pt does not have a diagnosis of ALS, Myasthenia Gravis or Lambert-Eaton Syndrome.  Risks and benefits of injections discussed and pt agrees to proceed with the procedure.  Written consent obtained  These injections are medically necessary. Pt  receives good benefits from these injections. These injections do not cause sedations or hallucinations which the oral therapies may cause.  Description of procedure:  The patient was placed in a sitting position. The standard protocol was used for Botox as follows, with 5 units of Botox injected at each site:   -Procerus muscle, midline injection  -Corrugator muscle, bilateral injection  -Frontalis muscle, bilateral injection, with 2 sites each side, medial injection was performed in the upper one third of the frontalis muscle, in the region vertical from the medial inferior edge of  the superior orbital rim. The lateral injection was again in the upper one third of the forehead vertically above the lateral limbus of the cornea, 1.5 cm lateral to the medial injection site.  -Temporalis  muscle injection, 4 sites, bilaterally. The first injection was 3 cm above the tragus of the ear, second injection site was 1.5 cm to 3 cm up from the first injection site in line with the tragus of the ear. The third injection site was 1.5-3 cm forward between the first 2 injection sites. The fourth injection site was 1.5 cm posterior to the second injection site.   -Occipitalis muscle injection, 3 sites, bilaterally. The first injection was done one half way between the occipital protuberance and the tip of the mastoid process behind the ear. The second injection site was done lateral and superior to the first, 1 fingerbreadth from the first injection. The third injection site was 1 fingerbreadth superiorly and medially from the first injection site.  -Cervical paraspinal muscle injection, 2 sites, bilateral knee first injection site was 1 cm from the midline of the cervical spine, 3 cm inferior to the lower border of the occipital protuberance. The second injection site was 1.5 cm superiorly and laterally to the first injection site.  -Trapezius muscle injection was performed at 3 sites, bilaterally. The first injection site was in the upper trapezius muscle halfway between the inflection point of the neck, and the acromion. The second injection site was one half way between the acromion and the first injection site. The third injection was done between the first injection site and the inflection point of the neck.   Will return for repeat injection in 3 months.   200 units of Botox was used, 45u Botox not injected was wasted. The patient tolerated the procedure well, there were no complications of the above procedure.

## 2023-01-11 MED ORDER — KETOROLAC TROMETHAMINE 60 MG/2ML IM SOLN: INTRAMUSCULAR | 1 refills | Status: DC

## 2023-01-19 ENCOUNTER — Encounter: Payer: Self-pay | Admitting: Neurology

## 2023-01-20 ENCOUNTER — Telehealth: Payer: Self-pay

## 2023-01-20 ENCOUNTER — Other Ambulatory Visit (HOSPITAL_COMMUNITY): Payer: Self-pay

## 2023-01-20 DIAGNOSIS — G43009 Migraine without aura, not intractable, without status migrainosus: Secondary | ICD-10-CM

## 2023-01-20 MED ORDER — NURTEC 75 MG PO TBDP
75.0000 mg | ORAL_TABLET | Freq: Every day | ORAL | 6 refills | Status: DC | PRN
Start: 2023-01-20 — End: 2023-09-24

## 2023-01-20 NOTE — Addendum Note (Signed)
 Addended by: Bertram Savin on: 01/20/2023 11:01 AM   Modules accepted: Orders

## 2023-01-20 NOTE — Telephone Encounter (Signed)
 Pharmacy Patient Advocate Encounter   Received notification from Patient Advice Request messages that prior authorization for Nurtec 75MG  dispersible tablets is required/requested.   Insurance verification completed.   The patient is insured through [facility] .   Per test claim: Refill too soon. PA is not needed at this time. Medication was filled 01/19/2023. Next eligible fill date is 02/05/2023.

## 2023-01-23 ENCOUNTER — Other Ambulatory Visit (HOSPITAL_COMMUNITY): Payer: Self-pay

## 2023-01-27 ENCOUNTER — Encounter: Payer: Self-pay | Admitting: Neurology

## 2023-01-28 ENCOUNTER — Ambulatory Visit (INDEPENDENT_AMBULATORY_CARE_PROVIDER_SITE_OTHER): Payer: Self-pay | Admitting: Neurology

## 2023-01-28 ENCOUNTER — Encounter: Payer: Self-pay | Admitting: Neurology

## 2023-01-28 DIAGNOSIS — G43709 Chronic migraine without aura, not intractable, without status migrainosus: Secondary | ICD-10-CM

## 2023-01-28 MED ORDER — ONABOTULINUMTOXINA 100 UNITS IJ SOLR
100.0000 [IU] | Freq: Once | INTRAMUSCULAR | Status: DC
Start: 2023-01-28 — End: 2023-11-30

## 2023-01-28 NOTE — Progress Notes (Signed)
 Botox- 100 units x 1 vial  SAMPLE Lot: [id number] Expiration: 04/2025 NDC: 4540-9811-91  Bacteriostatic 0.9% Sodium Chloride- 2 mL  Lot: [id number] Expiration: 04/21/2023 NDC: 6213-0865-78  Dx: I69.629 SAMPLE Witnessed by Leeann Must RN

## 2023-01-28 NOTE — Patient Instructions (Addendum)
 Added another 20 units to each masseter, 5 was not enough, Patient feels that her migraines cause her jaw to ache in her jaw aching also can cause her migraines to worsen it is a trigger for migraines included botox  as above in each masseter to see if that helps with migraine severity.

## 2023-03-11 ENCOUNTER — Telehealth: Payer: Self-pay

## 2023-03-11 ENCOUNTER — Other Ambulatory Visit (HOSPITAL_COMMUNITY): Payer: Self-pay

## 2023-03-11 NOTE — Telephone Encounter (Signed)
*  [facility]  Pharmacy Patient Advocate Encounter   Received notification from CoverMyMeds that prior authorization for Ketorolac Tromethamine 60MG /2ML intramuscular solutions  is required/requested.   Insurance verification completed.   The patient is insured through [facility] .   Per test claim: PA required; PA submitted to above mentioned insurance via CoverMyMeds Key/confirmation #/EOC [id number] Status is pending

## 2023-03-20 ENCOUNTER — Other Ambulatory Visit (HOSPITAL_COMMUNITY): Payer: Self-pay

## 2023-03-20 NOTE — Telephone Encounter (Signed)
 Denial letter received and attached to patients media tab.

## 2023-03-20 NOTE — Telephone Encounter (Signed)
 Pharmacy Patient Advocate Encounter  Received notification from [facility] that Prior Authorization for Ketorolac Tromethamine 60MG /2ML intramuscular solutions  has been DENIED.  No reason given; No denial letter received via Fax or CMM. It has been requested and will be uploaded to the media tab once received.   PA #/Case ID/Reference #: [id number]    Called plan to have denial letter recent- will attach to patients file and update when received.

## 2023-04-06 ENCOUNTER — Ambulatory Visit: Payer: BC Managed Care – PPO | Admitting: Neurology

## 2023-04-06 DIAGNOSIS — G43709 Chronic migraine without aura, not intractable, without status migrainosus: Secondary | ICD-10-CM | POA: Diagnosis not present

## 2023-04-06 DIAGNOSIS — G43009 Migraine without aura, not intractable, without status migrainosus: Secondary | ICD-10-CM

## 2023-04-06 DIAGNOSIS — G43611 Persistent migraine aura with cerebral infarction, intractable, with status migrainosus: Secondary | ICD-10-CM

## 2023-04-06 MED ORDER — ONABOTULINUMTOXINA 200 UNITS IJ SOLR
155.0000 [IU] | Freq: Once | INTRAMUSCULAR | Status: AC
Start: 2023-04-06 — End: 2023-04-06
  Administered 2023-04-06: 155 [IU] via INTRAMUSCULAR

## 2023-04-06 MED ORDER — KETOROLAC TROMETHAMINE 60 MG/2ML IM SOLN: INTRAMUSCULAR | 11 refills | Status: AC

## 2023-04-06 NOTE — Progress Notes (Unsigned)
 Botox- 200 units x 1 vial Lot: [id number] Expiration: 06/2025 NDC: 6962-9528-41  Bacteriostatic 0.9% Sodium Chloride- * mL  Lot: [id number] Expiration: 11/21/2023 NDC: 0272-5366-44  Dx: I34.742 B/B Witnessed by Alverda Skeans, RN

## 2023-04-06 NOTE — Progress Notes (Unsigned)
 Consent Form Botulism Toxin Injection For Chronic Migraine  She started zepbound and has lost 27 pounds in 3 months, going to 7.5  01/11/2023: stable. Patient feels that her migraines cause her jaw to ache in her jaw aching also can cause her migraines to worsen it is a trigger for migraines included 15 units in each masseter to see if that helps with migraine severity.  10/04/2022: Botox has been life changing. >70% improvement in migraine and headache freq and severity 07/22/2022: first botox  Reviewed orally with patient, additionally signature is on file:  Botulism toxin has been approved by the Federal drug administration for treatment of chronic migraine. Botulism toxin does not cure chronic migraine and it may not be effective in some patients.  The administration of botulism toxin is accomplished by injecting a small amount of toxin into the muscles of the neck and head. Dosage must be titrated for each individual. Any benefits resulting from botulism toxin tend to wear off after 3 months with a repeat injection required if benefit is to be maintained. Injections are usually done every 3-4 months with maximum effect peak achieved by about 2 or 3 weeks. Botulism toxin is expensive and you should be sure of what costs you will incur resulting from the injection.  The side effects of botulism toxin use for chronic migraine may include:   -Transient, and usually mild, facial weakness with facial injections  -Transient, and usually mild, head or neck weakness with head/neck injections  -Reduction or loss of forehead facial animation due to forehead muscle weakness  -Eyelid drooping  -Dry eye  -Pain at the site of injection or bruising at the site of injection  -Double vision  -Potential unknown long term risks  Contraindications: You should not have Botox if you are pregnant, nursing, allergic to albumin, have an infection, skin condition, or muscle weakness at the site of the injection,  or have myasthenia gravis, Lambert-Eaton syndrome, or ALS.  It is also possible that as with any injection, there may be an allergic reaction or no effect from the medication. Reduced effectiveness after repeated injections is sometimes seen and rarely infection at the injection site may occur. All care will be taken to prevent these side effects. If therapy is given over a long time, atrophy and wasting in the muscle injected may occur. Occasionally the patient's become refractory to treatment because they develop antibodies to the toxin. In this event, therapy needs to be modified.  I have read the above information and consent to the administration of botulism toxin.    BOTOX PROCEDURE NOTE FOR MIGRAINE HEADACHE    Contraindications and precautions discussed with patient(above). Aseptic procedure was observed and patient tolerated procedure. Procedure performed by Dr. Artemio Aly  The condition has existed for more than 6 months, and pt does not have a diagnosis of ALS, Myasthenia Gravis or Lambert-Eaton Syndrome.  Risks and benefits of injections discussed and pt agrees to proceed with the procedure.  Written consent obtained  These injections are medically necessary. Pt  receives good benefits from these injections. These injections do not cause sedations or hallucinations which the oral therapies may cause.  Description of procedure:  The patient was placed in a sitting position. The standard protocol was used for Botox as follows, with 5 units of Botox injected at each site:   -Procerus muscle, midline injection  -Corrugator muscle, bilateral injection  -Frontalis muscle, bilateral injection, with 2 sites each side, medial injection was performed in the upper  one third of the frontalis muscle, in the region vertical from the medial inferior edge of the superior orbital rim. The lateral injection was again in the upper one third of the forehead vertically above the lateral limbus of  the cornea, 1.5 cm lateral to the medial injection site.  -Temporalis muscle injection, 4 sites, bilaterally. The first injection was 3 cm above the tragus of the ear, second injection site was 1.5 cm to 3 cm up from the first injection site in line with the tragus of the ear. The third injection site was 1.5-3 cm forward between the first 2 injection sites. The fourth injection site was 1.5 cm posterior to the second injection site.   -Occipitalis muscle injection, 3 sites, bilaterally. The first injection was done one half way between the occipital protuberance and the tip of the mastoid process behind the ear. The second injection site was done lateral and superior to the first, 1 fingerbreadth from the first injection. The third injection site was 1 fingerbreadth superiorly and medially from the first injection site.  -Cervical paraspinal muscle injection, 2 sites, bilateral knee first injection site was 1 cm from the midline of the cervical spine, 3 cm inferior to the lower border of the occipital protuberance. The second injection site was 1.5 cm superiorly and laterally to the first injection site.  -Trapezius muscle injection was performed at 3 sites, bilaterally. The first injection site was in the upper trapezius muscle halfway between the inflection point of the neck, and the acromion. The second injection site was one half way between the acromion and the first injection site. The third injection was done between the first injection site and the inflection point of the neck.   Will return for repeat injection in 3 months.  Meds ordered this encounter  Medications   botulinum toxin Type A (BOTOX) injection 155 Units   ketorolac (TORADOL) 60 MG/2ML SOLN injection    Sig: Inject 60 mg into the muscle every 6 hours as needed for migraine. MAX 2 doses (120 mg) in 24 hours. Do not use more than 4 days per month.    Dispense:  16 mL    Refill:  11    Please dispense 23 gauge 1 in needles x 8  and 3 mL syringes x 8. Please advise pt to cleanse site with alcohol swab prior to injection and not re-use needles.   No orders of the defined types were placed in this encounter.     200 units of Botox was used, 45u Botox not injected was wasted. The patient tolerated the procedure well, there were no complications of the above procedure.

## 2023-04-28 ENCOUNTER — Other Ambulatory Visit: Payer: Self-pay | Admitting: Neurology

## 2023-04-28 MED ORDER — ZEPBOUND 10 MG/0.5ML ~~LOC~~ SOAJ: 10.0000 mg | SUBCUTANEOUS | 11 refills | Status: DC

## 2023-04-29 ENCOUNTER — Other Ambulatory Visit: Payer: Self-pay | Admitting: Neurology

## 2023-04-29 MED ORDER — ZEPBOUND 10 MG/0.5ML ~~LOC~~ SOLN: 10.0000 mg | SUBCUTANEOUS | 11 refills | Status: AC

## 2023-06-09 ENCOUNTER — Telehealth: Payer: Self-pay | Admitting: *Deleted

## 2023-06-09 NOTE — Telephone Encounter (Signed)
 We had received a fax from [facility] regarding Zepbound  Rx clarification vials vs pens. I talked to the pt. She said issue was already fixed. She is getting vials and paying oop.

## 2023-06-29 ENCOUNTER — Ambulatory Visit: Admitting: Neurology

## 2023-06-29 DIAGNOSIS — G43709 Chronic migraine without aura, not intractable, without status migrainosus: Secondary | ICD-10-CM | POA: Diagnosis not present

## 2023-06-29 MED ORDER — ONABOTULINUMTOXINA 200 UNITS IJ SOLR
155.0000 [IU] | Freq: Once | INTRAMUSCULAR | Status: AC
  Administered 2023-06-29: 155 [IU] via INTRAMUSCULAR

## 2023-06-29 NOTE — Progress Notes (Signed)
 Consent Form Botulism Toxin Injection For Chronic Migraine  06/29/2023: Botox  has been life changing. >70% improvement in migraine and headache freq and severity. She started zepbound  and has lost 50 pounds in 6 months, going to 7.5. then went up to 10mg  which made her sick. She will let me know she may want 5mg . Send me a mychart and how many refills.     01/11/2023: stable. Patient feels that her migraines cause her jaw to ache in her jaw aching also can cause her migraines to worsen it is a trigger for migraines included 15 units in each masseter to see if that helps with migraine severity.  10/04/2022: Botox  has been life changing. >70% improvement in migraine and headache freq and severity 07/22/2022: first botox   Reviewed orally with patient, additionally signature is on file:  Botulism toxin has been approved by the Federal drug administration for treatment of chronic migraine. Botulism toxin does not cure chronic migraine and it may not be effective in some patients.  The administration of botulism toxin is accomplished by injecting a small amount of toxin into the muscles of the neck and head. Dosage must be titrated for each individual. Any benefits resulting from botulism toxin tend to wear off after 3 months with a repeat injection required if benefit is to be maintained. Injections are usually done every 3-4 months with maximum effect peak achieved by about 2 or 3 weeks. Botulism toxin is expensive and you should be sure of what costs you will incur resulting from the injection.  The side effects of botulism toxin use for chronic migraine may include:   -Transient, and usually mild, facial weakness with facial injections  -Transient, and usually mild, head or neck weakness with head/neck injections  -Reduction or loss of forehead facial animation due to forehead muscle weakness  -Eyelid drooping  -Dry eye  -Pain at the site of injection or bruising at the site of injection  -Double  vision  -Potential unknown long term risks  Contraindications: You should not have Botox  if you are pregnant, nursing, allergic to albumin, have an infection, skin condition, or muscle weakness at the site of the injection, or have myasthenia gravis, Lambert-Eaton syndrome, or ALS.  It is also possible that as with any injection, there may be an allergic reaction or no effect from the medication. Reduced effectiveness after repeated injections is sometimes seen and rarely infection at the injection site may occur. All care will be taken to prevent these side effects. If therapy is given over a long time, atrophy and wasting in the muscle injected may occur. Occasionally the patient's become refractory to treatment because they develop antibodies to the toxin. In this event, therapy needs to be modified.  I have read the above information and consent to the administration of botulism toxin.    BOTOX  PROCEDURE NOTE FOR MIGRAINE HEADACHE    Contraindications and precautions discussed with patient(above). Aseptic procedure was observed and patient tolerated procedure. Procedure performed by Dr. Criselda Dolly  The condition has existed for more than 6 months, and pt does not have a diagnosis of ALS, Myasthenia Gravis or Lambert-Eaton Syndrome.  Risks and benefits of injections discussed and pt agrees to proceed with the procedure.  Written consent obtained  These injections are medically necessary. Pt  receives good benefits from these injections. These injections do not cause sedations or hallucinations which the oral therapies may cause.  Description of procedure:  The patient was placed in a sitting position. The standard  protocol was used for Botox  as follows, with 5 units of Botox  injected at each site:   -Procerus muscle, midline injection  -Corrugator muscle, bilateral injection  -Frontalis muscle, bilateral injection, with 2 sites each side, medial injection was performed in the upper  one third of the frontalis muscle, in the region vertical from the medial inferior edge of the superior orbital rim. The lateral injection was again in the upper one third of the forehead vertically above the lateral limbus of the cornea, 1.5 cm lateral to the medial injection site.  -Temporalis muscle injection, 4 sites, bilaterally. The first injection was 3 cm above the tragus of the ear, second injection site was 1.5 cm to 3 cm up from the first injection site in line with the tragus of the ear. The third injection site was 1.5-3 cm forward between the first 2 injection sites. The fourth injection site was 1.5 cm posterior to the second injection site.   -Occipitalis muscle injection, 3 sites, bilaterally. The first injection was done one half way between the occipital protuberance and the tip of the mastoid process behind the ear. The second injection site was done lateral and superior to the first, 1 fingerbreadth from the first injection. The third injection site was 1 fingerbreadth superiorly and medially from the first injection site.  -Cervical paraspinal muscle injection, 2 sites, bilateral knee first injection site was 1 cm from the midline of the cervical spine, 3 cm inferior to the lower border of the occipital protuberance. The second injection site was 1.5 cm superiorly and laterally to the first injection site.  -Trapezius muscle injection was performed at 3 sites, bilaterally. The first injection site was in the upper trapezius muscle halfway between the inflection point of the neck, and the acromion. The second injection site was one half way between the acromion and the first injection site. The third injection was done between the first injection site and the inflection point of the neck.   Will return for repeat injection in 3 months.  Meds ordered this encounter  Medications   botulinum toxin Type A  (BOTOX ) injection 155 Units    Botox - 200 units x 1 vial Lot:  [id number] Expiration: 10/2025 NDC: 4540-9811-91  Dx: Y78.295 B/B Witnessed by Andres Bangs CMA   No orders of the defined types were placed in this encounter.     200 units of Botox  was used, 45u Botox  not injected was wasted. The patient tolerated the procedure well, there were no complications of the above procedure.

## 2023-06-29 NOTE — Progress Notes (Signed)
 Botox - 200 units x 1 vial Lot: [id number] Expiration: 10/2025 NDC: 1884-1660-63  Bacteriostatic 0.9% Sodium Chloride - 4 mL  Lot: [id number] Expiration: 07/19/2024 NDC: 9323-5573-22  Dx: G25.427 B/B Witnessed by Andres Bangs CMA

## 2023-07-29 ENCOUNTER — Other Ambulatory Visit (HOSPITAL_COMMUNITY): Payer: Self-pay

## 2023-09-15 ENCOUNTER — Encounter: Payer: Self-pay | Admitting: Neurology

## 2023-09-22 ENCOUNTER — Ambulatory Visit: Admitting: Neurology

## 2023-09-24 ENCOUNTER — Encounter: Payer: Self-pay | Admitting: Neurology

## 2023-09-24 ENCOUNTER — Ambulatory Visit: Admitting: Neurology

## 2023-09-24 DIAGNOSIS — M2540 Effusion, unspecified joint: Secondary | ICD-10-CM | POA: Insufficient documentation

## 2023-09-24 DIAGNOSIS — G43009 Migraine without aura, not intractable, without status migrainosus: Secondary | ICD-10-CM

## 2023-09-24 DIAGNOSIS — G43709 Chronic migraine without aura, not intractable, without status migrainosus: Secondary | ICD-10-CM | POA: Diagnosis not present

## 2023-09-24 DIAGNOSIS — M7130 Other bursal cyst, unspecified site: Secondary | ICD-10-CM | POA: Insufficient documentation

## 2023-09-24 DIAGNOSIS — L409 Psoriasis, unspecified: Secondary | ICD-10-CM | POA: Insufficient documentation

## 2023-09-24 DIAGNOSIS — M79642 Pain in left hand: Secondary | ICD-10-CM | POA: Insufficient documentation

## 2023-09-24 MED ORDER — NURTEC 75 MG PO TBDP
75.0000 mg | ORAL_TABLET | Freq: Every day | ORAL | 6 refills | Status: AC | PRN
Start: 2023-09-24 — End: ?

## 2023-09-24 MED ORDER — ONABOTULINUMTOXINA 200 UNITS IJ SOLR
155.0000 [IU] | Freq: Once | INTRAMUSCULAR | Status: AC
Start: 2023-09-24 — End: 2023-09-24
  Administered 2023-09-24: 171 [IU] via INTRAMUSCULAR

## 2023-09-24 NOTE — Progress Notes (Signed)
   BOTOX  PROCEDURE NOTE FOR MIGRAINE HEADACHE  HISTORY: Abigail George is here for Botox  injection. Last was 06/29/23 with Dr. Ines. When Botox  wears off more headaches.  Would like to do masseter injections.  In the past when 15 units was administered bilaterally reported difficulty with smile.  However has significant tension to masseters triggering migraines.  Description of procedure:  The patient was placed in a sitting position. The standard protocol was used for Botox  as follows, with 5 units of Botox  injected at each site:  -Procerus muscle, midline injection  -Corrugator muscle, bilateral injection  -Frontalis muscle, bilateral injection, with 2 sites each side, medial injection was performed in the upper one third of the frontalis muscle, in the region vertical from the medial inferior edge of the superior orbital rim. The lateral injection was again in the upper one third of the forehead vertically above the lateral limbus of the cornea, 1.5 cm lateral to the medial injection site.  -Temporalis muscle injection, 4 sites, bilaterally. The first injection was 3 cm above the tragus of the ear, second injection site was 1.5 cm to 3 cm up from the first injection site in line with the tragus of the ear. The third injection site was 1.5-3 cm forward between the first 2 injection sites. The fourth injection site was 1.5 cm posterior to the second injection site.  -Occipitalis muscle injection, 3 sites, bilaterally. The first injection was done one half way between the occipital protuberance and the tip of the mastoid process behind the ear. The second injection site was done lateral and superior to the first, 1 fingerbreadth from the first injection. The third injection site was 1 fingerbreadth superiorly and medially from the first injection site.  -Cervical paraspinal muscle injection, 2 sites, bilateral, the first injection site was 1 cm from the midline of the cervical spine, 3 cm  inferior to the lower border of the occipital protuberance. The second injection site was 1.5 cm superiorly and laterally to the first injection site.  -Trapezius muscle injection was performed at 3 sites, bilaterally. The first injection site was in the upper trapezius muscle halfway between the inflection point of the neck, and the acromion. The second injection site was one half way between the acromion and the first injection site. The third injection was done between the first injection site and the inflection point of the neck.  8 units to bilateral massesters   A 200 unit bottle of Botox  was used, 171 units were injected, the rest of the Botox  was wasted. The patient tolerated the procedure well, there were no complications of the above procedure.  Botox  NDC 9976-6078-97 Lot number [id number] Expiration date 10/2025 BB  Asking for a refill on her hydrocodone .  I will refill her Nurtec.  She keeps hydrocodone  on hand in the event of a severe migraine to keep her from going to the ER.  Last fill was in November 2024 # 30. She has 1 left.

## 2023-09-24 NOTE — Progress Notes (Signed)
 Botox - 200 units x 1 vial Lot: [id number] Expiration: 10/2025 NDC: 9976-6078-97  Bacteriostatic 0.9% Sodium Chloride - 4 mL  Lot: [id number] Expiration: 11/19/2024 NDC: 9590-8033-97  Dx: H56.290 B/B Witnessed by Lauraine Born, NP

## 2023-09-28 ENCOUNTER — Telehealth: Payer: Self-pay | Admitting: Pharmacist

## 2023-09-28 ENCOUNTER — Other Ambulatory Visit (HOSPITAL_COMMUNITY): Payer: Self-pay

## 2023-09-28 NOTE — Telephone Encounter (Signed)
 Completed form faxed to [phone]

## 2023-09-28 NOTE — Telephone Encounter (Signed)
 Pharmacy Patient Advocate Encounter   Received notification from CoverMyMeds that prior authorization for Nurtec 75MG  dispersible tablets is required/requested.   Insurance verification completed.   The patient is insured through [facility] .

## 2023-09-29 NOTE — Telephone Encounter (Signed)
 Pharmacy Patient Advocate Encounter  Received notification from [facility] that Prior Authorization for NURTEC 75MG  ODT has been APPROVED from 09/28/2023 to 09/27/2024   PA #/Case ID/Reference #: [id number]

## 2023-09-30 ENCOUNTER — Other Ambulatory Visit: Payer: Self-pay | Admitting: Neurology

## 2023-09-30 MED ORDER — HYDROCODONE-ACETAMINOPHEN 5-325 MG PO TABS: 1.0000 | ORAL_TABLET | Freq: Four times a day (QID) | ORAL | 0 refills | Status: AC | PRN

## 2023-10-21 ENCOUNTER — Encounter: Payer: Self-pay | Admitting: *Deleted

## 2023-10-21 NOTE — Progress Notes (Signed)
 Abigail George                                          MRN: [id number]   10/21/2023   The [facility] Quality Team Specialist reviewed this patient medical record for the purposes of chart review for care gap closure. The following were reviewed: chart review for care gap closure-kidney health evaluation for diabetes:eGFR  and uACR.    VBCI Quality Team

## 2023-10-26 ENCOUNTER — Telehealth: Payer: Self-pay | Admitting: Neurology

## 2023-10-26 NOTE — Telephone Encounter (Signed)
 Lauraine, it looks like you recently saw pt for Botox . Are you ok with taking her injections over?

## 2023-10-26 NOTE — Telephone Encounter (Signed)
 Pt called to Follows about upcoming BOTOX  appt . Pt wants to know can she keep her appt or will she have to rescheduled  appt

## 2023-10-26 NOTE — Telephone Encounter (Signed)
 Called pt and moved to [place] schedule on 12/2 @ 8:15 am.

## 2023-10-26 NOTE — Telephone Encounter (Signed)
 Yes I can continue her Botox  by protocol if she would like.

## 2023-11-25 NOTE — Telephone Encounter (Signed)
 Faxed signed PA form and notes to [facility] @ [phone].

## 2023-11-25 NOTE — Telephone Encounter (Signed)
 Completed auth renewal form and placed in [place] office for signature.

## 2023-11-30 MED ORDER — ONABOTULINUMTOXINA 200 UNITS IJ SOLR: INTRAMUSCULAR | 2 refills | Status: AC

## 2023-11-30 NOTE — Telephone Encounter (Signed)
 Rx sent in

## 2023-11-30 NOTE — Telephone Encounter (Signed)
 Auth was approved, please send rx to [facility].  Auth#: [id number] (11/25/23-10/26/24)

## 2023-11-30 NOTE — Addendum Note (Signed)
 Addended by: JOSHUA HERRING L on: 11/30/2023 11:21 AM   Modules accepted: Orders

## 2023-12-22 ENCOUNTER — Ambulatory Visit: Admitting: Neurology

## 2023-12-22 VITALS — BP 126/80

## 2023-12-22 DIAGNOSIS — R519 Headache, unspecified: Secondary | ICD-10-CM

## 2023-12-22 DIAGNOSIS — G43009 Migraine without aura, not intractable, without status migrainosus: Secondary | ICD-10-CM

## 2023-12-22 DIAGNOSIS — G43709 Chronic migraine without aura, not intractable, without status migrainosus: Secondary | ICD-10-CM

## 2023-12-22 DIAGNOSIS — G43611 Persistent migraine aura with cerebral infarction, intractable, with status migrainosus: Secondary | ICD-10-CM

## 2023-12-22 MED ORDER — ONABOTULINUMTOXINA 200 UNITS IJ SOLR
155.0000 [IU] | Freq: Once | INTRAMUSCULAR | Status: AC
  Administered 2023-12-22: 155 [IU] via INTRAMUSCULAR

## 2023-12-22 NOTE — Progress Notes (Signed)
 Botox - 200 units x 1 vial Lot: [id number] Expiration: 2028/03 NDC: 0023-3921-02  Bacteriostatic 0.9% Sodium Chloride - 4 mL  Lot: [id number] Expiration: 11/19/24 NDC: 11/19/24  Dx: H56.290  S/P  Witnessed by [facility]

## 2023-12-22 NOTE — Progress Notes (Signed)
   BOTOX  PROCEDURE NOTE FOR MIGRAINE HEADACHE  HISTORY: Abigail George is here for Botox . Last was 09/24/23 with me. With Botox  she does great, has no more than 1 a week, Nurtec works excellent! When Botox  wears off 4-5 migraines a week. Has had excellent benefit from bilateral masseter injection, felt 8 units was not enough for clenching.   Description of procedure:  The patient was placed in a sitting position. The standard protocol was used for Botox  as follows, with 5 units of Botox  injected at each site:  -Procerus muscle, midline injection  -Corrugator muscle, bilateral injection  -Frontalis muscle, bilateral injection, with 2 sites each side, medial injection was performed in the upper one third of the frontalis muscle, in the region vertical from the medial inferior edge of the superior orbital rim. The lateral injection was again in the upper one third of the forehead vertically above the lateral limbus of the cornea, 1.5 cm lateral to the medial injection site.  -Temporalis muscle injection, 4 sites, bilaterally. The first injection was 3 cm above the tragus of the ear, second injection site was 1.5 cm to 3 cm up from the first injection site in line with the tragus of the ear. The third injection site was 1.5-3 cm forward between the first 2 injection sites. The fourth injection site was 1.5 cm posterior to the second injection site.  -Occipitalis muscle injection, 3 sites, bilaterally. The first injection was done one half way between the occipital protuberance and the tip of the mastoid process behind the ear. The second injection site was done lateral and superior to the first, 1 fingerbreadth from the first injection. The third injection site was 1 fingerbreadth superiorly and medially from the first injection site.  -Cervical paraspinal muscle injection, 2 sites, bilateral, the first injection site was 1 cm from the midline of the cervical spine, 3 cm inferior to the lower  border of the occipital protuberance. The second injection site was 1.5 cm superiorly and laterally to the first injection site.  -Trapezius muscle injection was performed at 3 sites, bilaterally. The first injection site was in the upper trapezius muscle halfway between the inflection point of the neck, and the acromion. The second injection site was one half way between the acromion and the first injection site. The third injection was done between the first injection site and the inflection point of the neck.  9 units bilateral masseters.    A 200 unit bottle of Botox  was used, 173 units were injected, the rest of the Botox  was wasted. The patient tolerated the procedure well, there were no complications of the above procedure.  Botox  NDC 9976-6078-97 Lot number [id number] Expiration date 03/2026 SP  No longer getting Zepbound  from our office. Uses Nurtec/Imitrex  PRN for acute migraine with excellent benefit.

## 2024-01-11 ENCOUNTER — Other Ambulatory Visit: Payer: Self-pay

## 2024-01-11 NOTE — Telephone Encounter (Signed)
 last prescibed by : Dr. Ines 01/08/2023 Last refilled by patient on : 11/23/23 Last office visit : 12/22/23 for a procedure visit  Next office visit : 04/12/24 with Dr. Buck  Per last office note : no notes to continue medication in any recent office visits    Please advise on refill request

## 2024-01-12 MED ORDER — ONDANSETRON 8 MG PO TBDP: 8.0000 mg | ORAL_TABLET | Freq: Three times a day (TID) | ORAL | 2 refills | Status: AC | PRN

## 2024-03-15 ENCOUNTER — Ambulatory Visit: Admitting: Neurology

## 2024-04-12 ENCOUNTER — Ambulatory Visit: Admitting: Neurology
